# Patient Record
Sex: Female | Born: 2000 | Race: Black or African American | Hispanic: No | Marital: Single | Smoking: Never smoker
Health system: Southern US, Community
[De-identification: ages and names within clinical notes are randomized; demographics above are authoritative.]

## PROBLEM LIST (undated history)

## (undated) DIAGNOSIS — T7840XA Allergy, unspecified, initial encounter: Secondary | ICD-10-CM

## (undated) DIAGNOSIS — K8301 Primary sclerosing cholangitis: Secondary | ICD-10-CM

## (undated) HISTORY — DX: Allergy, unspecified, initial encounter: T78.40XA

## (undated) HISTORY — DX: Primary sclerosing cholangitis: K83.01

---
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
## 2018-01-01 ENCOUNTER — Telehealth (HOSPITAL_COMMUNITY): Payer: Self-pay

## 2018-01-01 NOTE — Telephone Encounter (Signed)
Bacterial Vaginosis test is positive.  Prescription for metronidazole was given at the urgent care visit. Pt contacted regarding results. Answered all questions. Verbalized understanding.   

## 2018-04-29 ENCOUNTER — Ambulatory Visit (HOSPITAL_COMMUNITY)
Admission: EM | Admit: 2018-04-29 | Discharge: 2018-04-29 | Disposition: A | Discharge status: Home / Self Care | Payer: Managed Care, Other (non HMO) | Attending: Family Medicine | Admitting: Family Medicine

## 2018-04-29 ENCOUNTER — Encounter (HOSPITAL_COMMUNITY): Payer: Self-pay | Admitting: Emergency Medicine

## 2018-04-29 ENCOUNTER — Other Ambulatory Visit: Payer: Self-pay

## 2018-04-29 DIAGNOSIS — Z975 Presence of (intrauterine) contraceptive device: Secondary | ICD-10-CM | POA: Diagnosis not present

## 2018-04-29 DIAGNOSIS — Z3202 Encounter for pregnancy test, result negative: Secondary | ICD-10-CM | POA: Diagnosis not present

## 2018-04-29 DIAGNOSIS — N898 Other specified noninflammatory disorders of vagina: Secondary | ICD-10-CM | POA: Diagnosis not present

## 2018-04-29 LAB — POCT I-STAT, CHEM 8
BUN: 10 mg/dL (ref 6–20)
CALCIUM ION: 1.22 mmol/L (ref 1.15–1.40)
CHLORIDE: 102 mmol/L (ref 98–111)
CREATININE: 0.7 mg/dL (ref 0.44–1.00)
GLUCOSE: 82 mg/dL (ref 70–99)
HCT: 40 % (ref 36.0–46.0)
Hemoglobin: 13.6 g/dL (ref 12.0–15.0)
Potassium: 3.9 mmol/L (ref 3.5–5.1)
Sodium: 138 mmol/L (ref 135–145)
TCO2: 27 mmol/L (ref 22–32)

## 2018-04-29 LAB — POCT URINALYSIS DIP (DEVICE)
Bilirubin Urine: NEGATIVE
GLUCOSE, UA: NEGATIVE mg/dL
HGB URINE DIPSTICK: NEGATIVE
KETONES UR: NEGATIVE mg/dL
Leukocytes, UA: NEGATIVE
Nitrite: NEGATIVE
PH: 8.5 — AB (ref 5.0–8.0)
PROTEIN: NEGATIVE mg/dL
SPECIFIC GRAVITY, URINE: 1.015 (ref 1.005–1.030)
UROBILINOGEN UA: 4 mg/dL — AB (ref 0.0–1.0)

## 2018-04-29 LAB — POCT PREGNANCY, URINE: Preg Test, Ur: NEGATIVE

## 2018-04-29 MED ORDER — METRONIDAZOLE 500 MG PO TABS: 500.0000 mg | ORAL_TABLET | Freq: Two times a day (BID) | ORAL | 0 refills | Status: DC

## 2018-04-29 MED ORDER — FLUCONAZOLE 150 MG PO TABS: 150.0000 mg | ORAL_TABLET | Freq: Every day | ORAL | 0 refills | Status: DC

## 2018-04-29 NOTE — Discharge Instructions (Signed)
It was good seeing you today Holly Gonzalez! Take medications as prescribed. Drink plenty of fluids. You may or may not receive a call regarding your test results. Check mychart in a few days to review all of your test results. Follow-up with your GYN as soon as possible to get your IUD removed.

## 2018-04-29 NOTE — ED Triage Notes (Signed)
Pt reports yellow vaginal discharge and odor that started on Sunday.  She denies any other symptoms.

## 2018-04-29 NOTE — ED Provider Notes (Signed)
[facility]    CSN: [id number] Arrival date & time: 04/29/18  1248     History   Chief Complaint Chief Complaint  Patient presents with  . Vaginal Discharge    HPI Holly Gonzalez is a 17 y.o. female.   Subjective:   Holly Gonzalez is a 17 y.o. female who presents for evaluation of vaginal discharge yellow. Symptoms have been present for the past 4 days. She denies any abnormal bleeding, blisters, bumps, burning, local irritation, pain, urinary symptoms of dysuria, fever, flank pain, back pain, hematuria, lower abdominal pain, nausea, urinary frequency, urinary urgency, vomiting or vulvar itching. Notably, patient reports that she has had intermittent episodes of vaginal discharge in the past. This has been since having her IUD placed. 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   Menstrual pattern: bleeding regularly.  Contraception: IUD Sexually Active: with one female partner, uses condoms occasionally  STI Risk: Very low risk of STD exposure   The following portions of the patient's history were reviewed and updated as appropriate: allergies, current medications, past family history, past medical history, past social history, past surgical history and problem list.          History reviewed. No pertinent past medical history.  There are no active problems to display for this patient.   History reviewed. No pertinent surgical history.  OB History   None      Home Medications    Prior to Admission medications   Medication Sig Start Date End Date Taking? Authorizing Provider  fluconazole (DIFLUCAN) 150 MG tablet Take 1 tablet (150 mg total) by mouth daily. Take 1 tablet (150 mg) by mouth now then repeat once in 7 days after completing antibiotics 04/29/18   Enrique Sack, FNP  hydrocortisone (ANUSOL-HC) 2.5 % rectal cream  Apply rectally 2 times daily prn itching or pain 10/14/13   Gregor Hams, MD  metroNIDAZOLE (FLAGYL) 500 MG tablet Take 1 tablet (500 mg total) by mouth 2 (two) times daily. 04/29/18   Enrique Sack, FNP  polyethylene glycol powder (GLYCOLAX/MIRALAX) powder Take 17 g by mouth daily. 10/14/13   Gregor Hams, MD    Family History History reviewed. No pertinent family history.  Social History Social History   Tobacco Use  . Smoking status: Never Smoker  Substance Use Topics  . Alcohol use: No  . Drug use: No     Allergies   Penicillin g   Review of Systems Review of Systems  Constitutional: Negative for fever.  Genitourinary: Positive for vaginal discharge. Negative for decreased urine volume, dysuria, flank pain, frequency, genital sores, hematuria, menstrual problem, pelvic pain, urgency and vaginal pain.  Musculoskeletal: Negative for back pain.  All other systems reviewed and are negative.    Physical Exam Triage Vital Signs ED Triage Vitals [04/29/18 1344]  Enc Vitals Group     BP 106/66     Pulse Rate 92     Resp      Temp 98.5 F (36.9 C)     Temp Source Oral     SpO2 100 %     Weight      Height      Head Circumference      Peak Flow      Pain Score 0     Pain Loc      Pain Edu?  Excl. in [place]?    No data found.  Updated Vital Signs BP 106/66 (BP Location: Left Arm)   Pulse 92   Temp 98.5 F (36.9 C) (Oral)   LMP 04/12/2018 (Approximate)   SpO2 100%   Visual Acuity Right Eye Distance:   Left Eye Distance:   Bilateral Distance:    Right Eye Near:   Left Eye Near:    Bilateral Near:     Physical Exam  Constitutional: She is oriented to person, place, and time. She appears well-developed and well-nourished.  Neck: Normal range of motion.  Cardiovascular: Normal rate and regular rhythm.  Pulmonary/Chest: Effort normal and breath sounds normal.  Genitourinary:  Genitourinary Comments: Female chaperone present. External genitalia  without erythema, exudate or discharge. Vaginal vault with moderate amount of thin yellow discharge. Cervix is of normal color and without any lesions. The cervical os is closed. IUD string noted. No bleeding noted. Uterus is noted to be of normal size and nontender. No cervical motion tenderness. No palpable masses. The adnexa are without any massess or tenderness.   Musculoskeletal: Normal range of motion.  Neurological: She is alert and oriented to person, place, and time.  Skin: Skin is warm and dry.  Psychiatric: She has a normal mood and affect.     UC Treatments / Results  Labs (all labs ordered are listed, but only abnormal results are displayed) Labs Reviewed  POCT URINALYSIS DIP (DEVICE) - Abnormal; Notable for the following components:      Result Value   pH 8.5 (*)    Urobilinogen, UA 4.0 (*)    All other components within normal limits  POCT PREGNANCY, URINE  POCT I-STAT, CHEM 8  CERVICOVAGINAL ANCILLARY ONLY    EKG None  Radiology No results found.  Procedures Procedures (including critical care time)  Medications Ordered in UC Medications - No data to display  Initial Impression / Assessment and Plan / UC Course  I have reviewed the triage vital signs and the nursing notes.  Pertinent labs & imaging results that were available during my care of the patient were reviewed by me and considered in my medical decision making (see chart for details).    17 yo female presenting with vaginal discharge x 4 days. No abnormal bleeding, blisters, bumps, burning, local irritation, pain, urinary symptoms of dysuria, fever, flank pain, back pain, hematuria, lower abdominal pain, nausea, urinary frequency, urinary urgency, vomiting or vulvar itching. Urine pregnancy negative. UA Negative for glucose, protein, ketones, nitrates, leukocytes or blood. However, patient has elevated pH and urobilinogen. I-stat chem 8 unremarkable. Testing for GC/chlamydia, trichomonas, BV and  Candidiasis pending.  Plan:  Flagyl BID x 7 days  Diflucan 150 mg PO x 2, 1 tablet now and repeat once in 7 days after completing antibiotics  Drink plenty of fluids  Discussed safe sex. Follow up with GYN as soon as possible to get IUD removed   Today's evaluation has revealed no signs of a dangerous process. Discussed diagnosis with patient. Patient aware of their diagnosis, possible red flag symptoms to watch out for and need for close follow up. Patient understands verbal and written discharge instructions. Patient comfortable with plan and disposition.  Patient has a clear mental status at this time, good insight into illness (after discussion and teaching) and has clear judgment to make decisions regarding their care.  Documentation was completed with the aid of voice recognition software. Transcription may contain typographical errors.  Final Clinical Impressions(s) / UC Diagnoses  Final diagnoses:  Vaginal discharge     Discharge Instructions     It was good seeing you today Holly Gonzalez! Take medications as prescribed. Drink plenty of fluids. You may or may not receive a call regarding your test results. Check mychart in a few days to review all of your test results. Follow-up with your GYN as soon as possible to get your IUD removed.     ED Prescriptions    Medication Sig Dispense Auth. Provider   fluconazole (DIFLUCAN) 150 MG tablet Take 1 tablet (150 mg total) by mouth daily. Take 1 tablet (150 mg) by mouth now then repeat once in 7 days after completing antibiotics 2 tablet Enrique Sack, FNP   metroNIDAZOLE (FLAGYL) 500 MG tablet Take 1 tablet (500 mg total) by mouth 2 (two) times daily. 14 tablet Enrique Sack, FNP     Controlled Substance Prescriptions [place] Controlled Substance Registry consulted? Not Applicable   Enrique Sack, [place] 04/29/18 1541

## 2018-04-30 LAB — CERVICOVAGINAL ANCILLARY ONLY
BACTERIAL VAGINITIS: POSITIVE — AB
Candida vaginitis: NEGATIVE
Chlamydia: NEGATIVE
Neisseria Gonorrhea: NEGATIVE
TRICH (WINDOWPATH): NEGATIVE

## 2019-07-22 HISTORY — PX: MOUTH SURGERY: SHX715

## 2019-08-03 ENCOUNTER — Telehealth: Payer: Managed Care, Other (non HMO)

## 2019-10-20 ENCOUNTER — Ambulatory Visit: Payer: Managed Care, Other (non HMO) | Attending: Family

## 2019-10-20 DIAGNOSIS — Z23 Encounter for immunization: Secondary | ICD-10-CM

## 2019-10-20 NOTE — Progress Notes (Signed)
   Covid-19 Vaccination Clinic  Name:  Holly Gonzalez    MRN: [id number] DOB: 2001/05/09  10/20/2019  [name] was observed post Covid-19 immunization for 15 minutes without incident. She was provided with Vaccine Information Sheet and instruction to access the V-Safe system.   [name] was instructed to call 911 with any severe reactions post vaccine: Marland Kitchen Difficulty breathing  . Swelling of face and throat  . A fast heartbeat  . A bad rash all over body  . Dizziness and weakness   Immunizations Administered    Name Date Dose VIS Date Route   Moderna COVID-19 Vaccine 10/20/2019 12:57 PM 0.5 mL 06/21/2019 Intramuscular   Manufacturer: Moderna   Lot: [id number]   [id number]

## 2019-11-22 ENCOUNTER — Ambulatory Visit: Payer: Managed Care, Other (non HMO) | Attending: Family

## 2019-11-22 DIAGNOSIS — Z23 Encounter for immunization: Secondary | ICD-10-CM

## 2019-11-22 NOTE — Progress Notes (Signed)
   Covid-19 Vaccination Clinic  Name:  Holly Gonzalez    MRN: [id number] DOB: 2001/02/18  11/22/2019  [name] was observed post Covid-19 immunization for 15 minutes without incident. She was provided with Vaccine Information Sheet and instruction to access the V-Safe system.   [name] was instructed to call 911 with any severe reactions post vaccine: Marland Kitchen Difficulty breathing  . Swelling of face and throat  . A fast heartbeat  . A bad rash all over body  . Dizziness and weakness   Immunizations Administered    Name Date Dose VIS Date Route   Moderna COVID-19 Vaccine 11/22/2019  3:39 PM 0.5 mL 06/2019 Intramuscular   Manufacturer: Moderna   Lot: [id number]   [id number]

## 2020-05-22 ENCOUNTER — Ambulatory Visit: Payer: Self-pay

## 2020-05-30 ENCOUNTER — Encounter: Payer: Managed Care, Other (non HMO) | Admitting: Obstetrics and Gynecology

## 2020-06-12 ENCOUNTER — Encounter: Payer: Self-pay | Admitting: Obstetrics and Gynecology

## 2020-06-28 ENCOUNTER — Other Ambulatory Visit: Payer: Self-pay

## 2020-06-28 ENCOUNTER — Encounter: Payer: Self-pay | Admitting: Allergy

## 2020-06-28 ENCOUNTER — Ambulatory Visit (INDEPENDENT_AMBULATORY_CARE_PROVIDER_SITE_OTHER): Payer: Managed Care, Other (non HMO) | Admitting: Allergy

## 2020-06-28 VITALS — BP 98/60 | HR 81 | Temp 97.9°F | Resp 18 | Ht 62.0 in | Wt 158.8 lb

## 2020-06-28 DIAGNOSIS — H6122 Impacted cerumen, left ear: Secondary | ICD-10-CM

## 2020-06-28 DIAGNOSIS — J3089 Other allergic rhinitis: Secondary | ICD-10-CM | POA: Diagnosis not present

## 2020-06-28 DIAGNOSIS — H1013 Acute atopic conjunctivitis, bilateral: Secondary | ICD-10-CM | POA: Diagnosis not present

## 2020-06-28 MED ORDER — MONTELUKAST SODIUM 10 MG PO TABS: 10.0000 mg | ORAL_TABLET | Freq: Every day | ORAL | 5 refills | Status: DC

## 2020-06-28 NOTE — Progress Notes (Addendum)
New Patient Note  RE: Holly Gonzalez MRN: [id number] DOB: 05-28-2001 Date of Office Visit: 06/28/2020  Referring provider: No ref. provider found Primary care provider: Patient, No Pcp Per  Chief Complaint: seasonal allergies  History of present illness: Holly Gonzalez is a 19 y.o. female presenting today for evaluation of seasonal allergies.  She does not have a PCP at this time.  She states her allergies have worsened over years but this year has been the worst.  Symptoms include watery eyes, runny nose, sneezing, couhing.  spring and fall are worst seasons for her but symptoms are year-round. She has been on zyrtec for years.  Has tried claritin and states it works from time to time.  She has been taking allegra more recently but states doesn't help all the time.  She feels like allegra treats some symptoms but not all.   She has used nose sprays but does not "care for it at all".  She states she can feel the nose spray in her body and hates the feeling.  She states flonase was the worst nasal spray she has tried.  Has not tried nasal saline rinses before.  She states she is okay with using an eyedrop.  Her family does have horses and she is around them year-round. Denies any history of food allergy or any food allergy concerns at this time.  No history of asthma or eczema.  Review of systems: Review of Systems  Constitutional: Negative.   HENT:       See HPI  Eyes:       See HPI  Respiratory: Positive for cough. Negative for sputum production, shortness of breath and wheezing.   Cardiovascular: Negative.   Gastrointestinal: Negative.   Musculoskeletal: Negative.   Skin: Negative.   Neurological: Negative.     All other systems negative unless noted above in HPI  Past medical history: History reviewed. No pertinent past medical history.  Past surgical history: Past Surgical History:  Procedure Laterality Date  . MOUTH SURGERY  2021   tooth extraction    Family  history:  Family History  Problem Relation Age of Onset  . Asthma Neg Hx   . Eczema Neg Hx   . Allergic Disorder Neg Hx     Social history: Lives in a townhome with carpeting in the bedroom with central cooling.  No pets in the home.  There is no concern for water damage, mildew or roaches in the home.  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  Does not report a smoking history.  Medication List: Current Outpatient Medications  Medication Sig Dispense Refill  . fexofenadine (ALLEGRA) 180 MG tablet Take 180 mg by mouth daily as needed for allergies or rhinitis.    Marland Kitchen guaiFENesin (MUCINEX) 600 MG 12 hr tablet Take 600 mg by mouth 2 (two) times daily as needed.    . loratadine (CLARITIN) 10 MG tablet Take 10 mg by mouth daily as needed for allergies.    . polyethylene glycol powder (GLYCOLAX/MIRALAX) powder Take 17 g by mouth daily. 850 g 1   No current facility-administered medications for this visit.    Known medication allergies: Allergies  Allergen Reactions  . Penicillin G Hives     Physical examination: Blood pressure 98/60, pulse 81, temperature 97.9 F (36.6 C), temperature source Temporal, resp. rate 18, height 5' 2"  (1.575 m), weight 158 lb 12.8 oz (72 kg), SpO2 100 %.  General: Alert, interactive, in no acute  distress. HEENT: PERRLA, right TMs pearly gray, left TM obscured by cerumen impaction, turbinates moderately edematous without discharge, post-pharynx non erythematous. Neck: Supple without lymphadenopathy. Lungs: Clear to auscultation without wheezing, rhonchi or rales. {no increased work of breathing. CV: Normal S1, S2 without murmurs. Abdomen: Nondistended, nontender. Skin: Warm and dry, without lesions or rashes. Extremities:  No clubbing, cyanosis or edema. Neuro:   Grossly intact.  Diagnositics/Labs:  Allergy testing: Environmental allergy skin prick testing is positive to grass pollens, weed pollens, tree pollens, pullulara,  horse. Intradermal testing is negative Allergy testing results were read and interpreted by provider, documented by clinical staff.   Assessment and plan:   Allergic rhinitis with conjunctivitis   -Environmental allergy skin testing is positive to grass pollens, weed pollens, tree pollens, mold, horse   -Allergen avoidance measures discussed/handouts provided   -Recommend trial of Xyzal 5 mg daily as needed.  This is a long-acting antihistamine in the same category as Zyrtec, Allegra and Claritin.  Xyzal may be more effective for you than these other antihistamines.  It is over-the-counter.   -start Singulair 36m daily at bedtime.  This is not an antihistamine but can work alongside your antihistamine for better allergy symptom control.  If you notice any change in mood/behavior/sleep after starting Singulair then stop this medication and let u[place]now.  Symptoms resolve after stopping the medication.     -Discussed nasal saline rinses today and if interested can perform this to help clean/flush the nasal passages.  Use distilled water or boil water and bring down to room temperature prior to use.  Breathe through your mouth the entire process.  Provided with a rinse kit today   -For itchy/watery eyes use over-the-counter Pataday or Pataday Xtra Strength 1 drop each eye daily as needed   -Allergen immunotherapy discussed today including protocol, benefits and risk.  Informational handout provided.  If interested in this therapuetic option you can check with your insurance carrier for coverage.  Let u[place]now if you would like to proceed with this option.    Cerumen impaction   -Recommended she try over-the-counter Debrox to help loosen the wax and flush it out.  If this does not help would advise she go to an urgent care or identify PCP for ear irrigation.  Follow-up in 4-6 months or sooner if needed  I appreciate the opportunity to take part in Holly Gonzalez's care. Please do not hesitate to contact me  with questions.  Sincerely,   SPrudy Feeler MD Allergy/Immunology [facility] [place]

## 2020-06-28 NOTE — Patient Instructions (Addendum)
Allergic rhinitis with conjunctivitis   -Environmental allergy skin testing is positive to grass pollens, weed pollens, tree pollens, mold, horse   -Allergen avoidance measures discussed/handouts provided   -Recommend trial of Xyzal 5 mg daily as needed.  This is a long-acting antihistamine in the same category as Zyrtec, Allegra and Claritin.  Xyzal may be more effective for you than these other antihistamines.  It is over-the-counter.   -start Singulair 50m daily at bedtime.  This is not an antihistamine but can work alongside your antihistamine for better allergy symptom control.  If you notice any change in mood/behavior/sleep after starting Singulair then stop this medication and let [facility].  Symptoms resolve after stopping the medication.     -Discussed nasal saline rinses today and if interested can perform this to help clean/flush the nasal passages.  Use distilled water or boil water and bring down to room temperature prior to use.  Breathe through your mouth the entire process.  Provided with a rinse kit today   -For itchy/watery eyes use over-the-counter Pataday or Pataday Xtra Strength 1 drop each eye daily as needed   -Allergen immunotherapy discussed today including protocol, benefits and risk.  Informational handout provided.  If interested in this therapuetic option you can check with your insurance carrier for coverage.  Let [facility] if you would like to proceed with this option.    Follow-up in 4-6 months or sooner if needed

## 2020-07-16 ENCOUNTER — Ambulatory Visit
Admission: EM | Admit: 2020-07-16 | Discharge: 2020-07-16 | Disposition: A | Discharge status: Home / Self Care | Payer: Managed Care, Other (non HMO) | Attending: Emergency Medicine | Admitting: Emergency Medicine

## 2020-07-16 ENCOUNTER — Other Ambulatory Visit: Payer: Self-pay

## 2020-07-16 DIAGNOSIS — N898 Other specified noninflammatory disorders of vagina: Secondary | ICD-10-CM

## 2020-07-16 LAB — URINALYSIS, COMPLETE (UACMP) WITH MICROSCOPIC
Glucose, UA: NEGATIVE mg/dL
Hgb urine dipstick: NEGATIVE
Ketones, ur: 15 mg/dL — AB
Nitrite: NEGATIVE
Protein, ur: 30 mg/dL — AB
RBC / HPF: NONE SEEN RBC/hpf (ref 0–5)
Specific Gravity, Urine: 1.025 (ref 1.005–1.030)
pH: 6 (ref 5.0–8.0)

## 2020-07-16 LAB — WET PREP, GENITAL
Clue Cells Wet Prep HPF POC: NONE SEEN
Sperm: NONE SEEN
Trich, Wet Prep: NONE SEEN
Yeast Wet Prep HPF POC: NONE SEEN

## 2020-07-16 LAB — PREGNANCY, URINE: Preg Test, Ur: NEGATIVE

## 2020-07-16 NOTE — Discharge Instructions (Addendum)
Testing tonight did not reveal the presence of yeast infection, bacterial vaginosis, or trichomoniasis.  Your urine specimen did not clearly indicate the presence of infection.  We will send urine for culture.  Your vaginal swab is also being sent off for evaluation for possible gonorrhea and chlamydia.  If your urine culture, or your vaginal swabs are positive for any infectious agent we will treat you at that time.  Increase your oral fluid intake to increase urine production and flush your urinary system.  Use over-the-counter Azo-Standard as needed for urinary pressure.  Return for reevaluation should new symptoms develop or your existing symptoms worsen.

## 2020-07-16 NOTE — ED Triage Notes (Signed)
Pt c/o vaginal discharge grey/white in nature with an odor for approx 3 days.  Also c/o lower abdominal pressure with urination.  Denies n/v/d, fever, urinary urgency/frequency, hematuria.

## 2020-07-16 NOTE — ED Provider Notes (Signed)
[facility]    CSN: [id number] Arrival date & time: 07/16/20  9417      History   Chief Complaint Chief Complaint  Patient presents with  . Vaginal Discharge    HPI Holly Gonzalez is a 19 y.o. female.   HPI   19 year old female here for evaluation of vaginal discharge and pressure with urination.  Patient reports that her symptoms started 3 days ago.  Patient states she has had some vaginal itching with a clumpy white discharge that has a fishy odor.  Patient denies fever, back pain, blood in her urine, urinary urgency or frequency, or vaginal bleeding.  Patient is sexually active with a monogamous partner.  They do not use any sort of protection on a regular basis.  History reviewed. No pertinent past medical history.  There are no problems to display for this patient.   Past Surgical History:  Procedure Laterality Date  . MOUTH SURGERY  2021   tooth extraction    OB History   No obstetric history on file.      Home Medications    Prior to Admission medications   Medication Sig Start Date End Date Taking? Authorizing Provider  fexofenadine (ALLEGRA) 180 MG tablet Take 180 mg by mouth daily as needed for allergies or rhinitis.   Yes [provider]  guaiFENesin (MUCINEX) 600 MG 12 hr tablet Take 600 mg by mouth 2 (two) times daily as needed.    [provider]  loratadine (CLARITIN) 10 MG tablet Take 10 mg by mouth daily as needed for allergies.    [provider]  montelukast (SINGULAIR) 10 MG tablet Take 1 tablet (10 mg total) by mouth at bedtime. 06/28/20   Kennith Gain, MD  polyethylene glycol powder (GLYCOLAX/MIRALAX) powder Take 17 g by mouth daily. 10/14/13   Gregor Hams, MD    Family History Family History  Problem Relation Age of Onset  . Asthma Neg Hx   . Eczema Neg Hx   . Allergic Disorder Neg Hx     Social History Social History   Tobacco Use  . Smoking status: Never Smoker  . Smokeless  tobacco: Never Used  Vaping Use  . Vaping Use: Never used  Substance Use Topics  . Alcohol use: Yes    Alcohol/week: 2.0 standard drinks    Types: 2 Glasses of wine per week  . Drug use: No     Allergies   Penicillin g   Review of Systems Review of Systems  Constitutional: Negative for activity change, appetite change and fever.  Gastrointestinal: Positive for abdominal pain. Negative for nausea and vomiting.  Genitourinary: Positive for vaginal discharge. Negative for dysuria, frequency, hematuria, urgency, vaginal bleeding and vaginal pain.  Musculoskeletal: Negative for back pain.  Skin: Negative for rash.  Hematological: Negative.   Psychiatric/Behavioral: Negative.      Physical Exam Triage Vital Signs ED Triage Vitals  Enc Vitals Group     BP 07/16/20 1935 105/69     Pulse Rate 07/16/20 1935 84     Resp 07/16/20 1935 18     Temp 07/16/20 1935 98.5 F (36.9 C)     Temp Source 07/16/20 1935 Oral     SpO2 07/16/20 1935 100 %     Weight --      Height --      Head Circumference --      Peak Flow --      Pain Score 07/16/20 1930 2  Pain Loc --      Pain Edu? --      Excl. in [place]? --    No data found.  Updated Vital Signs BP 105/69 (BP Location: Left Arm)   Pulse 84   Temp 98.5 F (36.9 C) (Oral)   Resp 18   LMP 06/28/2020 (Approximate)   SpO2 100%   Visual Acuity Right Eye Distance:   Left Eye Distance:   Bilateral Distance:    Right Eye Near:   Left Eye Near:    Bilateral Near:     Physical Exam Vitals and nursing note reviewed.  Constitutional:      General: She is not in acute distress.    Appearance: Normal appearance. She is normal weight. She is not toxic-appearing.  HENT:     Head: Normocephalic and atraumatic.  Cardiovascular:     Rate and Rhythm: Normal rate and regular rhythm.     Pulses: Normal pulses.     Heart sounds: Normal heart sounds. No murmur heard. No gallop.   Pulmonary:     Effort: Pulmonary effort is normal.      Breath sounds: Normal breath sounds. No wheezing, rhonchi or rales.  Abdominal:     General: Abdomen is flat. Bowel sounds are normal.     Palpations: Abdomen is soft.     Tenderness: There is no abdominal tenderness. There is no right CVA tenderness, left CVA tenderness, guarding or rebound.  Skin:    General: Skin is warm and dry.     Capillary Refill: Capillary refill takes less than 2 seconds.     Findings: No erythema or rash.  Neurological:     General: No focal deficit present.     Mental Status: She is alert and oriented to person, place, and time.  Psychiatric:        Mood and Affect: Mood normal.        Behavior: Behavior normal.        Thought Content: Thought content normal.        Judgment: Judgment normal.      UC Treatments / Results  Labs (all labs ordered are listed, but only abnormal results are displayed) Labs Reviewed  WET PREP, GENITAL - Abnormal; Notable for the following components:      Result Value   WBC, Wet Prep HPF POC MODERATE (*)    All other components within normal limits  URINALYSIS, COMPLETE (UACMP) WITH MICROSCOPIC - Abnormal; Notable for the following components:   Color, Urine AMBER (*)    Bilirubin Urine MODERATE (*)    Ketones, ur 15 (*)    Protein, ur 30 (*)    Leukocytes,Ua TRACE (*)    Bacteria, UA FEW (*)    All other components within normal limits  CHLAMYDIA/NGC RT PCR ([facility] ONLY)  URINE CULTURE  PREGNANCY, URINE    EKG   Radiology No results found.  Procedures Procedures (including critical care time)  Medications Ordered in UC Medications - No data to display  Initial Impression / Assessment and Plan / UC Course  I have reviewed the triage vital signs and the nursing notes.  Pertinent labs & imaging results that were available during my care of the patient were reviewed by me and considered in my medical decision making (see chart for details).   Patient is here for evaluation of vaginal discharge and  pressure in her lower abdomen with urination.  Patient denies pain with urination, urinary urgency or frequency.  Patient's abdominal  exam is benign.  Her belly is soft, nontender, nondistended, with positive bowel sounds in all 4 quadrants.  No CVA tenderness.  Will send wet prep, UA, GC and chlamydia, and U. Preg.  Wet prep is negative for BV, trichomoniasis, or yeast.  Urinalysis shows trace leukocytes no nitrites 6-10 squamous and 6-10 WBCs with few bacteria.  Suspect contaminated sample will send for culture.  Pregnancy test is negative.  Gonorrhea and Chlamydia test is pending.   Final Clinical Impressions(s) / UC Diagnoses   Final diagnoses:  Vaginal discharge     Discharge Instructions     Testing tonight did not reveal the presence of yeast infection, bacterial vaginosis, or trichomoniasis.  Your urine specimen did not clearly indicate the presence of infection.  We will send urine for culture.  Your vaginal swab is also being sent off for evaluation for possible gonorrhea and chlamydia.  If your urine culture, or your vaginal swabs are positive for any infectious agent we will treat you at that time.  Increase your oral fluid intake to increase urine production and flush your urinary system.  Use over-the-counter Azo-Standard as needed for urinary pressure.  Return for reevaluation should new symptoms develop or your existing symptoms worsen.    ED Prescriptions    None     PDMP not reviewed this encounter.   Margarette Canada, NP 07/16/20 2031

## 2020-07-17 LAB — CHLAMYDIA/NGC RT PCR (ARMC ONLY)
Chlamydia Tr: NOT DETECTED
N gonorrhoeae: NOT DETECTED

## 2020-07-18 LAB — URINE CULTURE
Culture: 3000 — AB
Special Requests: NORMAL

## 2020-07-23 ENCOUNTER — Other Ambulatory Visit: Payer: Managed Care, Other (non HMO)

## 2020-07-23 DIAGNOSIS — Z20822 Contact with and (suspected) exposure to covid-19: Secondary | ICD-10-CM

## 2020-07-24 ENCOUNTER — Other Ambulatory Visit: Payer: Managed Care, Other (non HMO)

## 2020-07-25 LAB — NOVEL CORONAVIRUS, NAA: SARS-CoV-2, NAA: NOT DETECTED

## 2020-07-25 LAB — SARS-COV-2, NAA 2 DAY TAT

## 2020-09-21 ENCOUNTER — Other Ambulatory Visit: Payer: Self-pay | Admitting: *Deleted

## 2020-09-21 ENCOUNTER — Telehealth: Payer: Self-pay | Admitting: Allergy

## 2020-09-21 MED ORDER — MONTELUKAST SODIUM 10 MG PO TABS: 10.0000 mg | ORAL_TABLET | Freq: Every day | ORAL | 5 refills | Status: DC

## 2020-09-21 NOTE — Telephone Encounter (Signed)
Pt called and would like a refill for singulair to be sent in [place]. [address], [place], [place] [address]    Please advise.

## 2020-09-21 NOTE — Telephone Encounter (Signed)
Singulair has been sent to [facility].

## 2020-10-19 NOTE — Telephone Encounter (Signed)
Called patient and placed her on the schedule to be seen next week.

## 2020-10-19 NOTE — Telephone Encounter (Signed)
Does she have an upcoming follow-up appointment scheduled already?  I think the symptoms need to be discussed at her visits we can best determine what medication she may benefit from.

## 2020-10-19 NOTE — Telephone Encounter (Signed)
Patient called and states that Singulair is not helping her allergies anymore. Patient states she is taking one pill at night, but increased it to two pills at night which worked better, but she was running out of medication faster. Patient is requesting something stronger since we are coming up on pollen season. Patient also would like an eye drop called in for puffy/red and watering eyes. Patient tried regular eye drops from [employer] but states that made her eyes worse.  Please advise.

## 2020-10-24 ENCOUNTER — Encounter: Payer: Self-pay | Admitting: Allergy

## 2020-10-24 ENCOUNTER — Ambulatory Visit (INDEPENDENT_AMBULATORY_CARE_PROVIDER_SITE_OTHER): Payer: Managed Care, Other (non HMO) | Admitting: Allergy

## 2020-10-24 ENCOUNTER — Other Ambulatory Visit: Payer: Self-pay

## 2020-10-24 VITALS — BP 100/62 | HR 74 | Temp 98.4°F | Resp 20

## 2020-10-24 DIAGNOSIS — H1013 Acute atopic conjunctivitis, bilateral: Secondary | ICD-10-CM | POA: Diagnosis not present

## 2020-10-24 DIAGNOSIS — J3089 Other allergic rhinitis: Secondary | ICD-10-CM | POA: Diagnosis not present

## 2020-10-24 DIAGNOSIS — H6122 Impacted cerumen, left ear: Secondary | ICD-10-CM | POA: Diagnosis not present

## 2020-10-24 MED ORDER — OLOPATADINE HCL 0.2 % OP SOLN: 1.0000 [drp] | Freq: Every day | OPHTHALMIC | 5 refills | Status: DC | PRN

## 2020-10-24 MED ORDER — MONTELUKAST SODIUM 10 MG PO TABS: 10.0000 mg | ORAL_TABLET | Freq: Every day | ORAL | 5 refills | Status: DC

## 2020-10-24 MED ORDER — LEVOCETIRIZINE DIHYDROCHLORIDE 5 MG PO TABS: 5.0000 mg | ORAL_TABLET | Freq: Every day | ORAL | 5 refills | Status: AC | PRN

## 2020-10-24 MED ORDER — IPRATROPIUM BROMIDE 0.03 % NA SOLN: 2.0000 | Freq: Two times a day (BID) | NASAL | 5 refills | Status: AC

## 2020-10-24 MED ORDER — AFRIN NASAL SPRAY 0.05 % NA SOLN: NASAL | 2 refills | Status: AC

## 2020-10-24 NOTE — Patient Instructions (Addendum)
Allergic rhinitis with conjunctivitis   -Continue avoidance measures for grass pollens, weed pollens, tree pollens, mold, horse   -Recommend trial of Xyzal 5 mg daily as needed.  This is a long-acting antihistamine in the same category as Zyrtec, Allegra and Claritin.  Xyzal may be more effective for you than these other antihistamines. (this is OTC)   -Continue Singulair 10mg  daily at bedtime.  This is not an antihistamine but can work alongside your antihistamine for better allergy symptom control.  If you notice any change in mood/behavior/sleep after starting Singulair then stop this medication and let us know.  Symptoms resolve after stopping the medication.     -For severe nasal congestion use nasal Afrin (nasal decongestant) 2 sprays each nostril for no more than 3-5 days at a time.  After 5-15 minutes you should be able to breathe more freely through your nose.  Once you can breathe better then use nasal Atrovent below   - Use nasal Atrovent 2 sprays each nostril twice a day at this time.  Atrovent can help with runny nose as well as stuffy nose   -Use nasal saline rinses to help clean/flush the nasal passages.  Use distilled water or boil water and bring down to room temperature prior to use.  Breathe through your mouth the entire process.     -For itchy/watery eyes use Pataday or Pataday Xtra Strength 1 drop each eye daily as needed (this is OTC)   -Allergen immunotherapy discussed and can be started at anytime.  Would however recommend starting once you have settled in [place] and identified a new allergist there  Follow-up as needed

## 2020-10-24 NOTE — Progress Notes (Signed)
Follow-up Note  RE: Holly Gonzalez MRN: [id number] DOB: Apr 03, 2001 Date of Office Visit: 10/24/2020   History of present illness: Holly Gonzalez is a 20 y.o. female presenting today for follow-up of allergic rhinitis with conjunctivitis.  She was last seen in the office on 06/28/2020 by myself.  She states her allergies have been worsening.  She is interested in immunotherapy however she will be moving to [facility] in July for a new job with [employer]. She states she has been having more watery eyes and runny nose currently as her main symptoms.  She states when she went to the pharmacy she did not receive 1 prescription medication which she believes is the Singulair.  But she states she has not been taking any antihistamines.  Xyzal is recommended for her to take at her previous visit.  She also does not have any eyedrops for as needed use either. She states she has tried to use the over-the-counter earwax removal kits but does not believe she has been successful with the left ear.     Review of systems: Review of Systems  Constitutional: Negative.   HENT:       See HPI  Eyes:       See HPI  Respiratory: Negative.   Cardiovascular: Negative.   Gastrointestinal: Negative.   Musculoskeletal: Negative.   Skin: Negative.   Neurological: Negative.     All other systems negative unless noted above in HPI  Past medical/social/surgical/family history have been reviewed and are unchanged unless specifically indicated below.  No changes  Medication List: Current Outpatient Medications  Medication Sig Dispense Refill  . guaiFENesin (MUCINEX) 600 MG 12 hr tablet Take 600 mg by mouth 2 (two) times daily as needed.    Marland Kitchen ipratropium (ATROVENT) 0.03 % nasal spray Place 2 sprays into both nostrils 2 (two) times daily. 30 mL 5  . levocetirizine (XYZAL) 5 MG tablet Take 1 tablet (5 mg total) by mouth daily as needed for allergies. 30 tablet 5  . Olopatadine HCl (PATADAY) 0.2 % SOLN Apply 1 drop  to eye daily as needed. 2.5 mL 5  . oxymetazoline (AFRIN NASAL SPRAY) 0.05 % nasal spray 2 sprays each nostril for no more than 3-5 days at a time. 30 mL 2  . montelukast (SINGULAIR) 10 MG tablet Take 1 tablet (10 mg total) by mouth at bedtime. 30 tablet 5   No current facility-administered medications for this visit.     Known medication allergies: Allergies  Allergen Reactions  . Penicillin G Hives     Physical examination: Blood pressure 100/62, pulse 74, temperature 98.4 F (36.9 C), temperature source Temporal, resp. rate 20, SpO2 98 %.  General: Alert, interactive, in no acute distress. HEENT: PERRLA, right TM pearly gray, left canal with cerumen impaction unable to visualize TM, turbinates markedly edematous with clear discharge, post-pharynx non erythematous. Neck: Supple without lymphadenopathy. Lungs: Clear to auscultation without wheezing, rhonchi or rales. {no increased work of breathing. CV: Normal S1, S2 without murmurs. Abdomen: Nondistended, nontender. Skin: Warm and dry, without lesions or rashes. Extremities:  No clubbing, cyanosis or edema. Neuro:   Grossly intact.  Diagnositics/Labs: None today  Assessment and plan:   Allergic rhinitis with conjunctivitis   -Continue avoidance measures for grass pollens, weed pollens, tree pollens, mold, horse   -Recommend trial of Xyzal 5 mg daily as needed.  This is a long-acting antihistamine in the same category as Zyrtec, Allegra and Claritin.  Xyzal may be more effective for  you than these other antihistamines. (this is OTC)   -Continue Singulair 10mg  daily at bedtime.  This is not an antihistamine but can work alongside your antihistamine for better allergy symptom control.  If you notice any change in mood/behavior/sleep after starting Singulair then stop this medication and let us know.  Symptoms resolve after stopping the medication.     -For severe nasal congestion use nasal Afrin (nasal decongestant) 2 sprays each  nostril for no more than 3-5 days at a time.  After 5-15 minutes you should be able to breathe more freely through your nose.  Once you can breathe better then use nasal Atrovent below   - Use nasal Atrovent 2 sprays each nostril twice a day at this time.  Atrovent can help with runny nose as well as stuffy nose   -Use nasal saline rinses to help clean/flush the nasal passages.  Use distilled water or boil water and bring down to room temperature prior to use.  Breathe through your mouth the entire process.     -For itchy/watery eyes use Pataday or Pataday Xtra Strength 1 drop each eye daily as needed (this is OTC)   -Allergen immunotherapy discussed and can be started at anytime.  Would however recommend starting once you have settled in [place] and identified a new allergist there  Cerumen impaction of left ear -Advised to see if her PCP in curettage or flush out the cerumen impaction  Follow-up as needed Advised we can help identify a new allergist if she is unable to identify one in the [place] area  I appreciate the opportunity to take part in Holly Gonzalez's care. Please do not hesitate to contact me with questions.  Sincerely,   Prudy Feeler, MD Allergy/Immunology [facility]

## 2020-11-22 ENCOUNTER — Ambulatory Visit: Payer: Managed Care, Other (non HMO) | Admitting: Allergy

## 2020-11-22 DIAGNOSIS — J309 Allergic rhinitis, unspecified: Secondary | ICD-10-CM

## 2020-11-26 ENCOUNTER — Other Ambulatory Visit: Payer: Self-pay

## 2020-11-26 ENCOUNTER — Telehealth: Payer: Self-pay

## 2020-11-26 ENCOUNTER — Encounter: Payer: Self-pay | Admitting: Nurse Practitioner

## 2020-11-26 ENCOUNTER — Other Ambulatory Visit (HOSPITAL_COMMUNITY)
Admission: RE | Admit: 2020-11-26 | Discharge: 2020-11-26 | Disposition: A | Discharge status: Home / Self Care | Payer: Managed Care, Other (non HMO) | Source: Ambulatory Visit | Attending: Nurse Practitioner | Admitting: Nurse Practitioner

## 2020-11-26 ENCOUNTER — Ambulatory Visit: Payer: Managed Care, Other (non HMO) | Admitting: Nurse Practitioner

## 2020-11-26 VITALS — BP 92/60 | HR 74 | Temp 98.8°F | Ht 63.5 in | Wt 161.2 lb

## 2020-11-26 DIAGNOSIS — Z0000 Encounter for general adult medical examination without abnormal findings: Secondary | ICD-10-CM | POA: Diagnosis not present

## 2020-11-26 DIAGNOSIS — Z124 Encounter for screening for malignant neoplasm of cervix: Secondary | ICD-10-CM

## 2020-11-26 DIAGNOSIS — Z113 Encounter for screening for infections with a predominantly sexual mode of transmission: Secondary | ICD-10-CM | POA: Diagnosis present

## 2020-11-26 DIAGNOSIS — N760 Acute vaginitis: Secondary | ICD-10-CM

## 2020-11-26 DIAGNOSIS — B9689 Other specified bacterial agents as the cause of diseases classified elsewhere: Secondary | ICD-10-CM

## 2020-11-26 DIAGNOSIS — R945 Abnormal results of liver function studies: Secondary | ICD-10-CM | POA: Diagnosis not present

## 2020-11-26 DIAGNOSIS — R7989 Other specified abnormal findings of blood chemistry: Secondary | ICD-10-CM

## 2020-11-26 NOTE — Patient Instructions (Addendum)
Thank you for choosing [facility]  Go to lab for blood draw.  Preventive Care 22-22 Years Old, Female Preventive care refers to lifestyle choices and visits with your health care provider that can promote health and wellness. At this stage in your life, you may start seeing a primary care physician instead of a pediatrician. It is important to take responsibility for your health and well-being. Preventive care for young adults includes:  A yearly physical exam. This is also called an annual wellness visit.  Regular dental and eye exams.  Immunizations.  Screening for certain conditions.  Healthy lifestyle choices, such as: ? Eating a healthy diet. ? Getting regular exercise. ? Not using drugs or products that contain nicotine and tobacco. ? Limiting alcohol use. What can I expect for my preventive care visit? Physical exam Your health care provider may check your:  Height and weight. These may be used to calculate your BMI (body mass index). BMI is a measurement that tells if you are at a healthy weight.  Heart rate and blood pressure.  Body temperature.  Skin for abnormal spots. Counseling Your health care provider may ask you questions about your:  Past medical problems.  Family's medical history.  Alcohol, tobacco, and drug use.  Home life and relationship well-being.  Access to firearms.  Emotional well-being.  Diet, exercise, and sleep habits.  Sexual activity and sexual health.  Method of birth control.  Menstrual cycle.  Pregnancy history. What immunizations do I need? Vaccines are usually given at various ages, according to a schedule. Your health care provider will recommend vaccines for you based on your age, medical history, and lifestyle or other factors, such as travel or where you work.   What tests do I need? Blood tests  Lipid and cholesterol levels. These may be checked every 5 years starting at age 1.  Hepatitis C  test.  Hepatitis B test. Screening  Pelvic exam and Pap test. This may be done every 3 years starting at age 38.  STD (sexually transmitted disease) testing, if you are at risk.  BRCA-related cancer screening. This may be done if you have a family history of breast, ovarian, tubal, or peritoneal cancers. Other tests  Tuberculosis skin test.  Vision and hearing tests.  Skin exam.  Breast exam. Talk with your health care provider about your test results, treatment options, and if necessary, the need for more tests. Follow these instructions at home: Eating and drinking  Eat a healthy diet that includes fresh fruits and vegetables, whole grains, lean protein, and low-fat dairy products.  Drink enough fluid to keep your urine pale yellow.  Do not drink alcohol if: ? Your health care provider tells you not to drink. ? You are pregnant, may be pregnant, or are planning to become pregnant. ? You are under the legal drinking age. In the [place] the legal drinking age is 62.  If you drink alcohol: ? Limit how much you use to 0-1 drink a day. ? Be aware of how much alcohol is in your drink. In the [place] one drink equals one 12 oz bottle of beer (355 mL), one 5 oz glass of wine (148 mL), or one 1 oz glass of hard liquor (44 mL).   Lifestyle  Take daily care of your teeth and gums. Brush your teeth every morning and night with fluoride toothpaste. Floss one time each day.  Stay active. Exercise for at least 30 minutes 5 or more days of the  week.  Do not use any products that contain nicotine or tobacco, such as cigarettes, e-cigarettes, and chewing tobacco. If you need help quitting, ask your health care provider.  Do not use drugs.  If you are sexually active, practice safe sex. Use a condom or other form of protection to prevent STIs (sexually transmitted infections).  If you do not wish to become pregnant, use a form of birth control. If you plan to become pregnant, see your  health care provider for a prepregnancy visit.  Find healthy ways to cope with stress, such as: ? Meditation, yoga, or listening to music. ? Journaling. ? Talking to a trusted person. ? Spending time with friends and family. Safety  Always wear your seat belt while driving or riding in a vehicle.  Do not drive: ? If you have been drinking alcohol. Do not ride with someone who has been drinking. ? When you are tired or distracted. ? While texting.  Wear a helmet and other protective equipment during sports activities.  If you have firearms in your house, make sure you follow all gun safety procedures.  Seek help if you have been bullied, physically abused, or sexually abused.  Use the Internet responsibly to avoid dangers, such as online bullying and online sex predators. What's next?  Go to your health care provider once a year for an annual wellness visit.  Ask your health care provider how often you should have your eyes and teeth checked.  Stay up to date on all vaccines. This information is not intended to replace advice given to you by your health care provider. Make sure you discuss any questions you have with your health care provider. Document Revised: 03/04/2020 Document Reviewed: 07/01/2018 Elsevier Patient Education  2021 [employer].

## 2020-11-26 NOTE — Telephone Encounter (Signed)
I called pt and LDM asking for a call back and instructing pt of follow up instructions per Nche ,  Pt is to use Debrox for ear wax build up, 3-5 drops in lt ear every night for 3 nights and then to schedule a office visit with Nche 1st available to get ear irrigation.

## 2020-11-26 NOTE — Progress Notes (Signed)
Subjective:    Patient ID: Holly Gonzalez, female    DOB: 2000-11-14, 20 y.o.   MRN: [id number]  Patient presents today for CPE   Chest Pain  This is a recurrent problem. The current episode started more than 1 month ago. The onset quality is sudden. The problem occurs intermittently. The problem has been unchanged. The pain is present in the epigastric region. The pain is mild. The quality of the pain is described as pressure. The pain does not radiate. Pertinent negatives include no abdominal pain, back pain, cough, diaphoresis, dizziness, exertional chest pressure, fever, headaches, irregular heartbeat, malaise/fatigue, nausea, orthopnea, palpitations, PND, shortness of breath, sputum production, syncope, vomiting or weakness. The pain is aggravated by nothing. She has tried nothing for the symptoms. There are no known risk factors.  Pertinent negatives for past medical history include no aneurysm, no anxiety/panic attacks, no CAD, no diabetes, no DVT, no hyperlipidemia, no hypertension, no Kawasaki disease, no PE, no rheumatic fever, no sleep apnea, no stimulant use and no valve disorder.  Her family medical history is significant for diabetes and hypertension.  Pertinent negatives for family medical history include: no CAD, no connective tissue disease, no heart disease, no hyperlipidemia, no Marfan's syndrome, no early MI, no PE, no PVD, no sickle cell disease, no stroke, no sudden death and no TIA.   Sexual History (orientation,birth control, marital status, STD):sexually active, agreed to pelvic, breast , and STD exam today/  Depression/Suicide: Depression screen [facility] 2/9 11/26/2020  Decreased Interest 0  Down, Depressed, Hopeless 0  PHQ - 2 Score 0   No flowsheet data found.  Vision:will schedule  Dental:will schedule  Immunizations: (TDAP, Hep C screen, Pneumovax, Influenza, zoster)  Health Maintenance  Topic Date Due  . HIV Screening  Never done  . Hepatitis C Screening: USPSTF  Recommendation to screen - Ages 72-79 yo.  Never done  . Pap Smear  Never done  . Pap Smear  Never done  . COVID-19 Vaccine (3 - Booster for Moderna series) 05/24/2020  . Flu Shot  02/18/2021  . Tetanus Vaccine  05/29/2029  . HPV Vaccine  Completed   Diet:heart healthy Exercise: 4-5x/week Weight:  Wt Readings from Last 3 Encounters:  11/26/20 161 lb 3.2 oz (73.1 kg)  06/28/20 158 lb 12.8 oz (72 kg)   Fall Risk: Fall Risk  11/26/2020  Number falls in past yr: 0  Injury with Fall? 0  Risk for fall due to : No Fall Risks  Follow up Falls evaluation completed   Medications and allergies reviewed with patient and updated if appropriate.  There are no problems to display for this patient.   Current Outpatient Medications on File Prior to Visit  Medication Sig Dispense Refill  . ipratropium (ATROVENT) 0.03 % nasal spray Place 2 sprays into both nostrils 2 (two) times daily. 30 mL 5  . levocetirizine (XYZAL) 5 MG tablet Take 1 tablet (5 mg total) by mouth daily as needed for allergies. 30 tablet 5  . Olopatadine HCl (PATADAY) 0.2 % SOLN Apply 1 drop to eye daily as needed. 2.5 mL 5  . oxymetazoline (AFRIN NASAL SPRAY) 0.05 % nasal spray 2 sprays each nostril for no more than 3-5 days at a time. 30 mL 2   No current facility-administered medications on file prior to visit.    Past Medical History:  Diagnosis Date  . Allergy     Past Surgical History:  Procedure Laterality Date  . MOUTH SURGERY  2021  tooth extraction    Social History   Socioeconomic History  . Marital status: Single    Spouse name: Not on file  . Number of children: 0  . Years of education: Not on file  . Highest education level: Not on file  Occupational History    Comment: Bio-chem and Journalism  Tobacco Use  . Smoking status: Never Smoker  . Smokeless tobacco: Never Used  Vaping Use  . Vaping Use: Never used  Substance and Sexual Activity  . Alcohol use: Yes    Alcohol/week: 2.0 standard  drinks    Types: 2 Glasses of wine per week  . Drug use: No  . Sexual activity: Yes    Birth control/protection: None  Other Topics Concern  . Not on file  Social History Narrative  . Not on file   Social Determinants of Health   Financial Resource Strain: Not on file  Food Insecurity: Not on file  Transportation Needs: Not on file  Physical Activity: Not on file  Stress: Not on file  Social Connections: Not on file    Family History  Problem Relation Age of Onset  . Diabetes Maternal Aunt   . Hyperlipidemia Maternal Aunt   . Diabetes Paternal Uncle   . Hyperlipidemia Paternal Uncle   . Diabetes Maternal Grandmother   . Hyperlipidemia Maternal Grandmother   . Asthma Neg Hx   . Eczema Neg Hx   . Allergic Disorder Neg Hx         Review of Systems  Constitutional: Negative for diaphoresis, fever, malaise/fatigue and weight loss.  HENT: Negative for congestion and sore throat.   Eyes:       Negative for visual changes  Respiratory: Negative for cough, sputum production and shortness of breath.   Cardiovascular: Positive for chest pain. Negative for palpitations, orthopnea, leg swelling, syncope and PND.  Gastrointestinal: Negative for abdominal pain, blood in stool, constipation, diarrhea, heartburn, nausea and vomiting.  Genitourinary: Negative for dysuria, frequency and urgency.  Musculoskeletal: Negative for back pain, falls, joint pain and myalgias.  Skin: Negative for rash.  Neurological: Negative for dizziness, sensory change, weakness and headaches.  Endo/Heme/Allergies: Does not bruise/bleed easily.  Psychiatric/Behavioral: Negative.    Objective:   Vitals:   11/26/20 1316  BP: 92/60  Pulse: 74  Temp: 98.8 F (37.1 C)  SpO2: 99%   Body mass index is 28.11 kg/m.  Physical Examination:  Physical Exam Vitals reviewed. Exam conducted with a chaperone present.  Constitutional:      General: She is not in acute distress.    Appearance: She is  well-developed and normal weight.  HENT:     Right Ear: Tympanic membrane, ear canal and external ear normal.     Left Ear: Tympanic membrane, ear canal and external ear normal.  Eyes:     Extraocular Movements: Extraocular movements intact.     Conjunctiva/sclera: Conjunctivae normal.  Cardiovascular:     Rate and Rhythm: Normal rate and regular rhythm.     Pulses: Normal pulses.     Heart sounds: Normal heart sounds.  Pulmonary:     Effort: Pulmonary effort is normal. No respiratory distress.     Breath sounds: Normal breath sounds.  Chest:     Chest wall: No tenderness.  Abdominal:     General: Bowel sounds are normal.     Palpations: Abdomen is soft.     Hernia: There is no hernia in the left inguinal area or right inguinal area.  Genitourinary:  General: Normal vulva.     Labia:        Right: No rash, tenderness or lesion.        Left: No rash, tenderness or lesion.      Vagina: Vaginal discharge present. No erythema or tenderness.     Cervix: Normal.     Uterus: Normal.      Adnexa: Right adnexa normal and left adnexa normal.  Musculoskeletal:        General: Normal range of motion.     Cervical back: Normal range of motion and neck supple.     Right lower leg: No edema.     Left lower leg: No edema.  Lymphadenopathy:     Lower Body: No right inguinal adenopathy. No left inguinal adenopathy.  Skin:    General: Skin is warm and dry.  Neurological:     Mental Status: She is alert and oriented to person, place, and time.     Deep Tendon Reflexes: Reflexes are normal and symmetric.  Psychiatric:        Mood and Affect: Mood normal.        Behavior: Behavior normal.        Thought Content: Thought content normal.    ASSESSMENT and PLAN: This visit occurred during the SARS-CoV-2 public health emergency.  Safety protocols were in place, including screening questions prior to the visit, additional usage of staff PPE, and extensive cleaning of exam room while observing  appropriate contact time as indicated for disinfecting solutions.   Wenona was seen today for establish care.  Diagnoses and all orders for this visit:  Preventative health care -     CBC -     Comprehensive metabolic panel -     TSH  Encounter for Papanicolaou smear for cervical cancer screening -     Cytology - PAP( [place]) -     Cervicovaginal ancillary only( [place])  Screen for STD (sexually transmitted disease) -     Cervicovaginal ancillary only( [place]) -     HIV antibody (with reflex) -     RPR -     Hepatitis C Antibody      Problem List Items Addressed This Visit   None   Visit Diagnoses    Preventative health care    -  Primary   Relevant Orders   CBC   Comprehensive metabolic panel   TSH   Encounter for Papanicolaou smear for cervical cancer screening       Relevant Orders   Cytology - PAP( [place])   Cervicovaginal ancillary only( [place])   Screen for STD (sexually transmitted disease)       Relevant Orders   Cervicovaginal ancillary only( [place])   HIV antibody (with reflex)   RPR   Hepatitis C Antibody      Follow up: Return in about 1 year (around 11/26/2021) for CPE (fasting).  Wilfred Lacy, NP

## 2020-11-27 LAB — CBC
HCT: 40.3 % (ref 36.0–46.0)
Hemoglobin: 14.3 g/dL (ref 12.0–15.0)
MCHC: 35.5 g/dL (ref 30.0–36.0)
MCV: 94.6 fl (ref 78.0–100.0)
Platelets: 318 10*3/uL (ref 150.0–400.0)
RBC: 4.26 Mil/uL (ref 3.87–5.11)
RDW: 13.1 % (ref 11.5–15.5)
WBC: 4.9 10*3/uL (ref 4.0–10.5)

## 2020-11-27 LAB — COMPREHENSIVE METABOLIC PANEL
ALT: 134 U/L — ABNORMAL HIGH (ref 0–35)
AST: 97 U/L — ABNORMAL HIGH (ref 0–37)
Albumin: 4.5 g/dL (ref 3.5–5.2)
Alkaline Phosphatase: 695 U/L — ABNORMAL HIGH (ref 39–117)
BUN: 11 mg/dL (ref 6–23)
CO2: 28 mEq/L (ref 19–32)
Calcium: 9.9 mg/dL (ref 8.4–10.5)
Chloride: 100 mEq/L (ref 96–112)
Creatinine, Ser: 0.67 mg/dL (ref 0.40–1.20)
GFR: 124.6 mL/min (ref >60.00)
Glucose, Bld: 82 mg/dL (ref 70–99)
Potassium: 4.1 mEq/L (ref 3.5–5.1)
Sodium: 136 mEq/L (ref 135–145)
Total Bilirubin: 1.5 mg/dL — ABNORMAL HIGH (ref 0.2–1.2)
Total Protein: 8.1 g/dL (ref 6.0–8.3)

## 2020-11-27 LAB — TSH: TSH: 0.83 u[IU]/mL (ref 0.35–4.50)

## 2020-11-27 LAB — HEPATITIS C ANTIBODY
Hepatitis C Ab: NONREACTIVE
SIGNAL TO CUT-OFF: 0.01 (ref <1.00)

## 2020-11-27 LAB — RPR: RPR Ser Ql: NONREACTIVE

## 2020-11-27 LAB — HIV ANTIBODY (ROUTINE TESTING W REFLEX): HIV 1&2 Ab, 4th Generation: NONREACTIVE

## 2020-11-27 NOTE — Addendum Note (Signed)
Addended by: Leana Gamer on: 11/27/2020 03:27 PM   Modules accepted: Orders

## 2020-11-28 LAB — CYTOLOGY - PAP
Adequacy: ABSENT
Diagnosis: NEGATIVE

## 2020-11-29 ENCOUNTER — Ambulatory Visit (HOSPITAL_BASED_OUTPATIENT_CLINIC_OR_DEPARTMENT_OTHER)
Admission: RE | Admit: 2020-11-29 | Discharge: 2020-11-29 | Disposition: A | Discharge status: Home / Self Care | Payer: Managed Care, Other (non HMO) | Source: Ambulatory Visit | Attending: Nurse Practitioner | Admitting: Nurse Practitioner

## 2020-11-29 DIAGNOSIS — R7989 Other specified abnormal findings of blood chemistry: Secondary | ICD-10-CM

## 2020-11-29 DIAGNOSIS — R945 Abnormal results of liver function studies: Secondary | ICD-10-CM | POA: Diagnosis not present

## 2020-11-29 MED ORDER — METRONIDAZOLE 0.75 % VA GEL: 1.0000 | Freq: Every day | VAGINAL | 0 refills | Status: DC

## 2020-11-29 NOTE — Addendum Note (Signed)
Addended by: Leana Gamer on: 11/29/2020 07:20 PM   Modules accepted: Orders

## 2020-11-30 LAB — CERVICOVAGINAL ANCILLARY ONLY
Chlamydia: NEGATIVE
Comment: NEGATIVE
Comment: NEGATIVE
Comment: NEGATIVE
Comment: NORMAL
HSV1: NEGATIVE
HSV2: NEGATIVE
Neisseria Gonorrhea: NEGATIVE
Trichomonas: NEGATIVE

## 2020-12-03 ENCOUNTER — Other Ambulatory Visit: Payer: Self-pay

## 2020-12-04 ENCOUNTER — Ambulatory Visit: Payer: Managed Care, Other (non HMO) | Admitting: Nurse Practitioner

## 2020-12-06 ENCOUNTER — Encounter: Payer: Self-pay | Admitting: Nurse Practitioner

## 2020-12-06 ENCOUNTER — Other Ambulatory Visit: Payer: Self-pay

## 2020-12-06 ENCOUNTER — Ambulatory Visit: Payer: Managed Care, Other (non HMO) | Admitting: Nurse Practitioner

## 2020-12-06 VITALS — BP 100/64 | HR 66 | Temp 98.1°F | Ht 63.5 in | Wt 160.0 lb

## 2020-12-06 DIAGNOSIS — R112 Nausea with vomiting, unspecified: Secondary | ICD-10-CM

## 2020-12-06 DIAGNOSIS — H6122 Impacted cerumen, left ear: Secondary | ICD-10-CM | POA: Diagnosis not present

## 2020-12-06 DIAGNOSIS — R945 Abnormal results of liver function studies: Secondary | ICD-10-CM | POA: Diagnosis not present

## 2020-12-06 DIAGNOSIS — R1013 Epigastric pain: Secondary | ICD-10-CM | POA: Diagnosis not present

## 2020-12-06 DIAGNOSIS — R7989 Other specified abnormal findings of blood chemistry: Secondary | ICD-10-CM

## 2020-12-06 LAB — CBC WITH DIFFERENTIAL/PLATELET
Basophils Absolute: 0 10*3/uL (ref 0.0–0.1)
Basophils Relative: 0.9 % (ref 0.0–3.0)
Eosinophils Absolute: 0.2 10*3/uL (ref 0.0–0.7)
Eosinophils Relative: 4.5 % (ref 0.0–5.0)
HCT: 38.5 % (ref 36.0–46.0)
Hemoglobin: 13.9 g/dL (ref 12.0–15.0)
Lymphocytes Relative: 26.7 % (ref 12.0–46.0)
Lymphs Abs: 1.3 10*3/uL (ref 0.7–4.0)
MCHC: 36 g/dL (ref 30.0–36.0)
MCV: 92.2 fl (ref 78.0–100.0)
Monocytes Absolute: 0.8 10*3/uL (ref 0.1–1.0)
Monocytes Relative: 17.2 % — ABNORMAL HIGH (ref 3.0–12.0)
Neutro Abs: 2.4 10*3/uL (ref 1.4–7.7)
Neutrophils Relative %: 50.7 % (ref 43.0–77.0)
Platelets: 296 10*3/uL (ref 150.0–400.0)
RBC: 4.18 Mil/uL (ref 3.87–5.11)
RDW: 13 % (ref 11.5–15.5)
WBC: 4.7 10*3/uL (ref 4.0–10.5)

## 2020-12-06 LAB — HEPATIC FUNCTION PANEL
ALT: 75 U/L — ABNORMAL HIGH (ref 0–35)
AST: 55 U/L — ABNORMAL HIGH (ref 0–37)
Albumin: 4.1 g/dL (ref 3.5–5.2)
Alkaline Phosphatase: 523 U/L — ABNORMAL HIGH (ref 39–117)
Bilirubin, Direct: 0.3 mg/dL (ref 0.0–0.3)
Total Bilirubin: 1 mg/dL (ref 0.2–1.2)
Total Protein: 7.2 g/dL (ref 6.0–8.3)

## 2020-12-06 LAB — HCG, QUANTITATIVE, PREGNANCY: Quantitative HCG: 0.61 m[IU]/mL

## 2020-12-06 MED ORDER — PROMETHAZINE HCL 12.5 MG PO TABS: 12.5000 mg | ORAL_TABLET | Freq: Three times a day (TID) | ORAL | 0 refills | Status: DC | PRN

## 2020-12-06 NOTE — Patient Instructions (Signed)
Go to lab for blood draw. Use promethazine for nausea  Liver Function Tests Why am I having this test? Liver function tests are done to see how well your liver is working. The proteins and enzymes measured in the tests can alert your health care provider to inflammation, damage, or disease in your liver. It is common to have liver function tests:  When you are taking certain medicines.  If you have liver disease.  If you drink a lot of alcohol.  During annual physical exams.  When you have other conditions that may affect your liver.  If you have symptoms such as yellowing of the skin (jaundice), abdominal pain, or nausea and vomiting. What is being tested? These tests measure various substances in your blood. This may include:  Alanine aminotransferase (ALT). This is an enzyme in the liver.  Aspartate aminotransferase (AST). This is an enzyme in the liver, heart, and muscles.  Alkaline phosphatase (ALP). This is a protein in the liver, bile ducts, bone, and other body tissues.  Total bilirubin. This is a yellow pigment in bile.  Albumin. This is a protein in the liver.  Prothrombin time and international normalized ratio (PT and INR). PT measures the time it takes for your blood to clot. INR is a calculation of blood clotting time based on your PT result.  Total protein. This includes two proteins, albumin and globulin, found in the blood. What kind of sample is taken? A blood sample is required for this test. It is usually collected by inserting a needle into a blood vessel.   How do I prepare for this test? How you prepare will depend on which tests are being done and the reason for doing them. You may need to:  Avoid eating for 4-6 hours before the test, or as told by your health care provider. Follow instructions from your health care provider about eating or drinking restrictions before the tests.  Stop taking certain medicines before your blood test, as told by your  health care provider. Tell a health care provider about:  All medicines you are taking, including vitamins, herbs, eye drops, creams, and over-the-counter medicines.  Any medical conditions you have.  Whether you are pregnant or may be pregnant. How are the results reported? Your test results will be reported as values. Your health care provider will compare your results to normal ranges that were established after testing a large group of people (reference ranges). Reference ranges may vary among labs and hospitals. For the substances measured in liver function tests, common reference ranges are: ALT  Infant: 10-40 international units/L.  Child or adult: 4-36 international units/L at 37C or 4-36 units/L (SI units).  Reference ranges may be higher for older adults. AST  Newborn 55-31 days old: 35-140 units/L.  Child younger than 60 years old: 15-60 units/L.  29-19 years old: 15-50 units/L.  76-36 years old: 10-50 units/L.  44-72 years old: 10-40 units/L.  Adult: 0-35 units/L or 0-0.58 microkatals/L (SI units).  Reference ranges may be higher for older adults. ALP  Child younger than 71 years old: 85-235 units/L.  81-36 years old: 65-210 units/L.  60-19 years old: 60-300 units/L.  2-31 years old: 30-200 units/L.  Adult: 30-120 units/L or 0.5-2.0 microkatals/L (SI units).  Reference ranges may be higher for older adults. Total bilirubin  Newborn: 1.0-12.0 mg/dL or 17.1-205 micromoles/L (SI units).  Child or adult: 0.3-1.0 mg/dL or 5.1-17 micromoles/L. Albumin  Premature infant: 3.0-4.2 g/dL.  Newborn: 3.5-5.4 g/dL.  Infant:  4.4-5.4 g/dL.  Child: 4.0-5.9 g/dL.  Adult: 3.5-5.0 g/dL or 35-50 g/L (SI units). PT  11.0-12.5 seconds; 85%-100%. INR  0.8-1.1. Total protein  Premature infant: 4.2-7.6 g/dL.  Newborn: 4.6-7.4 g/dL.  Infant: 6.0-6.7 g/dL.  Child: 6.2-8.0 g/dL.  Adult: 6.4-8.3 g/dL or 64-83 g/L (SI units). What do the results mean? Results that  are within the reference ranges are considered normal. For each substance measured, results outside the reference range can indicate various health issues. ALT  Levels above the normal range may indicate liver disease. Sometimes levels also increase after burns, surgery, heart attack, muscle damage, or seizure. AST  Levels above the normal range may indicate liver disease, skeletal muscle diseases, or other diseases that destroy the red blood cells or tissues of the pancreas.  Levels below the normal range may indicate acute kidney disease, pregnancy, or diabetic ketoacidosis. ALP  Levels above the normal range may be seen in biliary obstruction, liver diseases, bone disease, thyroid disease, lymphoma, or several other conditions. People with blood type O or B may show higher levels after a fatty meal.  Levels below the normal range may indicate bone and teeth conditions, malnutrition, protein deficiency, or Wilson's disease. Total bilirubin  Levels above the normal range may indicate problems with the liver, gallbladder, or bile ducts. Albumin  Levels above the normal range may indicate dehydration. They may also be caused by a diet that is high in protein.  Levels below the normal range may indicate kidney disease, liver disease, or malabsorption of nutrients. PT and INR  Levels above the normal range mean that your blood is clotting slower than normal. This may be due to blood disorders, liver disorders, certain medicines like warfarin, or low levels of vitamin K. Total protein  Levels above the normal range may be due to infection or other diseases.  Levels below the normal range may be due to an immune system disorder, bleeding, burns, kidney disorder, liver disease, trouble absorbing or getting nutrients, or other conditions that affect the intestines. Talk with your health care provider about what your results mean. Questions to ask your health care provider Ask your health  care provider, or the department that is doing the test:  When will my results be ready?  How will I get my results?  What are my treatment options?  What other tests do I need?  What are my next steps? Summary  Liver function tests are done to see how well your liver is working.  The results can alert your health care provider to inflammation, damage, or disease in your liver.  You may need to avoid eating for 4-6 hours before the test or stop taking certain medicines before your blood test, as told by your health care provider.  Talk with your health care provider about what your results mean. This information is not intended to replace advice given to you by your health care provider. Make sure you discuss any questions you have with your health care provider. Document Revised: 04/10/2020 Document Reviewed: 04/10/2020 Elsevier Patient Education  2021 [employer].

## 2020-12-06 NOTE — Progress Notes (Signed)
Subjective:  Patient ID: Holly Gonzalez, female    DOB: [birth year]  Age: 20 y.o. MRN: [id number]  CC: Acute Visit (Pt c/o clogged ears, left ear worse than right)  GI Problem The primary symptoms include nausea and vomiting. Primary symptoms do not include fever, weight loss, fatigue, abdominal pain, diarrhea, melena, hematemesis, jaundice, hematochezia, dysuria, myalgias, arthralgias or rash. The illness began 2 days ago. The onset was gradual.  The illness is also significant for anorexia. The illness does not include chills, dysphagia, odynophagia, bloating, constipation, tenesmus, back pain or itching. Associated medical issues do not include inflammatory bowel disease, GERD, alcohol abuse, gastric bypass or irritable bowel syndrome.  Ear Fullness  There is pain in the right ear. This is a new problem. The current episode started more than 1 month ago. The problem has been unchanged. There has been no fever. Associated symptoms include vomiting. Pertinent negatives include no abdominal pain, coughing, diarrhea, ear discharge, rash, rhinorrhea or sore throat. She has tried nothing for the symptoms. Her past medical history is significant for hearing loss.   Reviewed past Medical, Social and Family history today.  Outpatient Medications Prior to Visit  Medication Sig Dispense Refill  . ipratropium (ATROVENT) 0.03 % nasal spray Place 2 sprays into both nostrils 2 (two) times daily. 30 mL 5  . levocetirizine (XYZAL) 5 MG tablet Take 1 tablet (5 mg total) by mouth daily as needed for allergies. 30 tablet 5  . oxymetazoline (AFRIN NASAL SPRAY) 0.05 % nasal spray 2 sprays each nostril for no more than 3-5 days at a time. 30 mL 2  . metroNIDAZOLE (METROGEL VAGINAL) 0.75 % vaginal gel Place 1 Applicatorful vaginally at bedtime. (Patient not taking: Reported on 12/06/2020) 70 g 0  . Olopatadine HCl (PATADAY) 0.2 % SOLN Apply 1 drop to eye daily as needed. (Patient not taking: Reported on 12/06/2020) 2.5  mL 5   No facility-administered medications prior to visit.    ROS See HPI  Objective:  BP 100/64 (BP Location: Left Arm, Patient Position: Sitting, Cuff Size: Normal)   Pulse 66   Temp 98.1 F (36.7 C) (Temporal)   Ht 5' 3.5" (1.613 m)   Wt 160 lb (72.6 kg)   LMP 11/12/2020 (Exact Date)   SpO2 98%   BMI 27.90 kg/m   Physical Exam Vitals reviewed.  HENT:     Right Ear: Tympanic membrane, ear canal and external ear normal. There is no impacted cerumen.     Left Ear: External ear normal. There is impacted cerumen.     Ears:     Comments: Normal ear canal and TM after cerumen removal) Abdominal:     General: There is no distension.     Palpations: Abdomen is soft.     Tenderness: There is no guarding.  Neurological:     Mental Status: She is alert and oriented to person, place, and time.    Assessment & Plan:  This visit occurred during the SARS-CoV-2 public health emergency.  Safety protocols were in place, including screening questions prior to the visit, additional usage of staff PPE, and extensive cleaning of exam room while observing appropriate contact time as indicated for disinfecting solutions.   Mahrukh was seen today for acute visit.  Diagnoses and all orders for this visit:  Nausea and vomiting, intractability of vomiting not specified, unspecified vomiting type -     B-HCG Quant -     promethazine (PHENERGAN) 12.5 MG tablet; Take 1 tablet (12.5 mg total)  by mouth every 8 (eight) hours as needed for nausea or vomiting. -     CBC w/Diff  Impacted cerumen of left ear  Abnormal LFTs (liver function tests) -     Hepatic function panel -     Hepatitis B Core Antibody, IgM -     Hepatitis B Core Antibody, total -     Hepatitis E antibody, IgM  Procedure Note :    Procedure :  Ear irrigation  Indication:  Cerumen impaction (left)  Risks, including pain, dizziness, eardrum perforation, bleeding, infection and others as well as benefits were explained to the  patient in detail. Verbal consent was obtained and the patient agreed to proceed.   We used "The Elephant Ear Irrigation Device" filled with lukewarm water for irrigation. A large amount wax was recovered. Procedure has also required manual wax removal with an ear loop.  Tolerated well. Complications: None.  Postprocedure instructions :  Call if problems.  Problem List Items Addressed This Visit   None   Visit Diagnoses    Nausea and vomiting, intractability of vomiting not specified, unspecified vomiting type    -  Primary   Relevant Medications   promethazine (PHENERGAN) 12.5 MG tablet   Other Relevant Orders   B-HCG Quant   CBC w/Diff   Impacted cerumen of left ear       Abnormal LFTs (liver function tests)       Relevant Orders   Hepatic function panel   Hepatitis B Core Antibody, IgM   Hepatitis B Core Antibody, total   Hepatitis E antibody, IgM      Follow-up: Return if symptoms worsen or fail to improve.  Holly Lacy, NP

## 2020-12-07 ENCOUNTER — Other Ambulatory Visit: Payer: Self-pay

## 2020-12-07 ENCOUNTER — Encounter (HOSPITAL_COMMUNITY): Payer: Self-pay | Admitting: Emergency Medicine

## 2020-12-07 ENCOUNTER — Emergency Department (HOSPITAL_COMMUNITY)
Admission: EM | Admit: 2020-12-07 | Discharge: 2020-12-08 | Disposition: A | Discharge status: Home / Self Care | Payer: Managed Care, Other (non HMO) | Attending: Emergency Medicine | Admitting: Emergency Medicine

## 2020-12-07 DIAGNOSIS — R112 Nausea with vomiting, unspecified: Secondary | ICD-10-CM | POA: Diagnosis not present

## 2020-12-07 DIAGNOSIS — R7401 Elevation of levels of liver transaminase levels: Secondary | ICD-10-CM | POA: Insufficient documentation

## 2020-12-07 DIAGNOSIS — R7989 Other specified abnormal findings of blood chemistry: Secondary | ICD-10-CM

## 2020-12-07 DIAGNOSIS — R1111 Vomiting without nausea: Secondary | ICD-10-CM

## 2020-12-07 DIAGNOSIS — R1013 Epigastric pain: Secondary | ICD-10-CM | POA: Insufficient documentation

## 2020-12-07 NOTE — Addendum Note (Signed)
Addended by: Leana Gamer on: 12/07/2020 06:22 PM   Modules accepted: Orders

## 2020-12-07 NOTE — ED Triage Notes (Signed)
Patient here from home reporting abd pain, n/v since Monday. States that she went to PCP and they reported she had elevated liver enzymes. Denies pregnancy.

## 2020-12-08 LAB — CBC
HCT: 38.3 % (ref 36.0–46.0)
Hemoglobin: 13.6 g/dL (ref 12.0–15.0)
MCH: 32.4 pg (ref 26.0–34.0)
MCHC: 35.5 g/dL (ref 30.0–36.0)
MCV: 91.2 fL (ref 80.0–100.0)
Platelets: 304 10*3/uL (ref 150–400)
RBC: 4.2 MIL/uL (ref 3.87–5.11)
RDW: 12.8 % (ref 11.5–15.5)
WBC: 6.1 10*3/uL (ref 4.0–10.5)
nRBC: 0 % (ref 0.0–0.2)

## 2020-12-08 LAB — COMPREHENSIVE METABOLIC PANEL
ALT: 87 U/L — ABNORMAL HIGH (ref 0–44)
AST: 76 U/L — ABNORMAL HIGH (ref 15–41)
Albumin: 3.8 g/dL (ref 3.5–5.0)
Alkaline Phosphatase: 498 U/L — ABNORMAL HIGH (ref 38–126)
Anion gap: 8 (ref 5–15)
BUN: 10 mg/dL (ref 6–20)
CO2: 23 mmol/L (ref 22–32)
Calcium: 8.7 mg/dL — ABNORMAL LOW (ref 8.9–10.3)
Chloride: 105 mmol/L (ref 98–111)
Creatinine, Ser: 0.67 mg/dL (ref 0.44–1.00)
GFR, Estimated: >60 mL/min (ref >60)
Glucose, Bld: 90 mg/dL (ref 70–99)
Potassium: 3.9 mmol/L (ref 3.5–5.1)
Sodium: 136 mmol/L (ref 135–145)
Total Bilirubin: 1.1 mg/dL (ref 0.3–1.2)
Total Protein: 7.6 g/dL (ref 6.5–8.1)

## 2020-12-08 LAB — LIPASE, BLOOD: Lipase: 25 U/L (ref 11–51)

## 2020-12-08 LAB — I-STAT BETA HCG BLOOD, ED (MC, WL, AP ONLY): I-stat hCG, quantitative: <5 m[IU]/mL (ref <5)

## 2020-12-08 MED ORDER — ONDANSETRON 4 MG PO TBDP: 4.0000 mg | ORAL_TABLET | Freq: Three times a day (TID) | ORAL | 0 refills | Status: DC | PRN

## 2020-12-08 MED ORDER — OMEPRAZOLE 20 MG PO CPDR: 20.0000 mg | DELAYED_RELEASE_CAPSULE | Freq: Two times a day (BID) | ORAL | 0 refills | Status: DC

## 2020-12-08 NOTE — ED Provider Notes (Signed)
[facility] DEPT Provider Note   CSN: [id number] Arrival date & time: 12/07/20  2101     History Chief Complaint  Patient presents with  . Abnormal Lab  . Abdominal Pain  . Nausea  . Emesis    Holly Gonzalez is a 20 y.o. female.  Patient presents to the emergency department with a chief complaint of nausea and vomiting.  She reports associated epigastric abdominal pain.  States that she has been having symptoms intermittently for the past couple of weeks.  Was seen recently by her PCP and was diagnosed with elevated LFTs.  She had negative outpatient right upper quadrant ultrasound.  He states that her symptoms persist, and this is what brought her back to the emergency department tonight.  She denies any fever or chills.  She has tried Phenergan, but denies any significant relief.  The history is provided by the patient. No language interpreter was used.       Past Medical History:  Diagnosis Date  . Allergy     There are no problems to display for this patient.   Past Surgical History:  Procedure Laterality Date  . MOUTH SURGERY  2021   tooth extraction     OB History   No obstetric history on file.     Family History  Problem Relation Age of Onset  . Diabetes Maternal Aunt   . Hyperlipidemia Maternal Aunt   . Diabetes Paternal Uncle   . Hyperlipidemia Paternal Uncle   . Diabetes Maternal Grandmother   . Hyperlipidemia Maternal Grandmother   . Asthma Neg Hx   . Eczema Neg Hx   . Allergic Disorder Neg Hx     Social History   Tobacco Use  . Smoking status: Never Smoker  . Smokeless tobacco: Never Used  Vaping Use  . Vaping Use: Never used  Substance Use Topics  . Alcohol use: Yes    Alcohol/week: 2.0 standard drinks    Types: 2 Glasses of wine per week  . Drug use: No    Home Medications Prior to Admission medications   Medication Sig Start Date End Date Taking? Authorizing Provider  ipratropium (ATROVENT) 0.03 %  nasal spray Place 2 sprays into both nostrils 2 (two) times daily. 10/24/20   Kennith Gain, MD  levocetirizine (XYZAL) 5 MG tablet Take 1 tablet (5 mg total) by mouth daily as needed for allergies. 10/24/20   Kennith Gain, MD  oxymetazoline (AFRIN NASAL SPRAY) 0.05 % nasal spray 2 sprays each nostril for no more than 3-5 days at a time. 10/24/20   Kennith Gain, MD  promethazine (PHENERGAN) 12.5 MG tablet Take 1 tablet (12.5 mg total) by mouth every 8 (eight) hours as needed for nausea or vomiting. 12/06/20   Nche, Charlene Brooke, NP    Allergies    Penicillin g  Review of Systems   Review of Systems  All other systems reviewed and are negative.   Physical Exam Updated Vital Signs BP (!) 147/129 (BP Location: Left Arm)   Pulse 72   Temp 98.5 F (36.9 C) (Oral)   Resp 17   Ht 5' 2"  (1.575 m)   Wt 75.5 kg   LMP 11/12/2020 (Exact Date)   SpO2 100%   BMI 30.43 kg/m   Physical Exam Vitals and nursing note reviewed.  Constitutional:      General: She is not in acute distress.    Appearance: She is well-developed.  HENT:     Head:  Normocephalic and atraumatic.  Eyes:     Conjunctiva/sclera: Conjunctivae normal.  Cardiovascular:     Rate and Rhythm: Normal rate and regular rhythm.     Heart sounds: No murmur heard.   Pulmonary:     Effort: Pulmonary effort is normal. No respiratory distress.     Breath sounds: Normal breath sounds.  Abdominal:     Palpations: Abdomen is soft.     Tenderness: There is abdominal tenderness.     Comments: Epigastric tenderness  Musculoskeletal:     Cervical back: Neck supple.  Skin:    General: Skin is warm and dry.  Neurological:     Mental Status: She is alert and oriented to person, place, and time.  Psychiatric:        Mood and Affect: Mood normal.        Behavior: Behavior normal.     ED Results / Procedures / Treatments   Labs (all labs ordered are listed, but only abnormal results are  displayed) Labs Reviewed  LIPASE, BLOOD  COMPREHENSIVE METABOLIC PANEL  CBC  URINALYSIS, ROUTINE W REFLEX MICROSCOPIC  I-STAT BETA HCG BLOOD, ED ([facility], [facility], [facility] ONLY)    EKG None  Radiology No results found.  Procedures Procedures   Medications Ordered in ED Medications - No data to display  ED Course  I have reviewed the triage vital signs and the nursing notes.  Pertinent labs & imaging results that were available during my care of the patient were reviewed by me and considered in my medical decision making (see chart for details).    MDM Rules/Calculators/A&P                          This patient complains of vomiting after eating, this involves an extensive number of treatment options, and is a complaint that carries with it a high risk of complications and morbidity.    Differential Dx Cholecystitis, pancreatitis, gastroenteritis, GERD  Pertinent Labs I ordered, reviewed, and interpreted labs, which included CBC notable for no leukocytosis, CMP without electrolyte derangement, but is notable for mildly elevated AST and ALT, with alk phos being 498.  T bili is 1.1.  Lipase is 25.  These labs are fairly similar to recent labs she has had outpatient.  Plan Patient reassessed, she appears comfortable.  Her vital signs are stable.  We discussed her lab work.  I do not feel that repeat ultrasound is indicated at this time, she does not have any focal right upper quadrant tenderness.  Her recent ultrasound was negative for gallstones.  Lipase is 25, so I think pancreatitis is less likely.  She states that she has been able to drink a smoothie and keep it down.  I wonder if she has esophageal stricture or GERD that is causing her symptoms.  We will put her on a PPI and recommend that she follow-up with gastroenterology.  She is agreeable with this plan.    Final Clinical Impression(s) / ED Diagnoses Final diagnoses:  Vomiting without nausea, intractability of vomiting not  specified, unspecified vomiting type  Elevated LFTs    Rx / DC Orders ED Discharge Orders         Ordered    Ambulatory referral to Gastroenterology        12/08/20 0213    ondansetron (ZOFRAN ODT) 4 MG disintegrating tablet  Every 8 hours PRN        12/08/20 0214    omeprazole (PRILOSEC)  20 MG capsule  2 times daily before meals        12/08/20 0214           Montine Circle, PA-C 12/08/20 0230    Ripley Fraise, MD 12/08/20 [phone]

## 2020-12-08 NOTE — Discharge Instructions (Addendum)
I placed a referral to the gastroenterologist.  Their office should contact you.  I have provided you with their contact information, you may call them as well.  Please take the medication as prescribed.  You may end up needing an endoscopy to further assess your condition.  If you run a fever, have bloody vomit, bloody diarrhea, or significant abdominal pain, please return to the emergency department.

## 2020-12-10 ENCOUNTER — Encounter (HOSPITAL_COMMUNITY): Payer: Self-pay | Admitting: Emergency Medicine

## 2020-12-10 ENCOUNTER — Telehealth: Payer: Self-pay | Admitting: Nurse Practitioner

## 2020-12-10 ENCOUNTER — Encounter: Payer: Self-pay | Admitting: Physician Assistant

## 2020-12-10 NOTE — Addendum Note (Signed)
Addended by: Leana Gamer on: 12/10/2020 01:42 PM   Modules accepted: Orders

## 2020-12-10 NOTE — Telephone Encounter (Signed)
Pt is wanting a cb concerning her most recent lab results. Please advise pt at [phone].

## 2020-12-11 NOTE — Addendum Note (Signed)
Addended by: Wilfred Lacy L on: 12/11/2020 12:32 PM   Modules accepted: Orders

## 2020-12-12 LAB — HEPATITIS B CORE ANTIBODY, TOTAL: Hep B Core Total Ab: NONREACTIVE

## 2020-12-12 LAB — TEST AUTHORIZATION

## 2020-12-12 LAB — HEPATITIS B CORE ANTIBODY, IGM: Hep B C IgM: NONREACTIVE

## 2020-12-12 LAB — HEPATITIS A ANTIBODY, TOTAL: Hepatitis A AB,Total: REACTIVE — AB

## 2020-12-12 LAB — HEPATITIS E ANTIBODY, IGM: HEPATITIS E ANTIBODY (IGM): NOT DETECTED

## 2020-12-12 LAB — TEST AUTHORIZATION 2

## 2020-12-12 LAB — HEPATITIS A ANTIBODY, IGM: Hep A IgM: NONREACTIVE

## 2020-12-12 NOTE — Addendum Note (Signed)
Addended by: Leana Gamer on: 12/12/2020 05:12 PM   Modules accepted: Orders

## 2020-12-13 NOTE — Telephone Encounter (Signed)
Spoke with patient on 12/12/20

## 2020-12-26 ENCOUNTER — Ambulatory Visit
Admission: RE | Admit: 2020-12-26 | Discharge: 2020-12-26 | Disposition: A | Discharge status: Home / Self Care | Payer: Managed Care, Other (non HMO) | Source: Ambulatory Visit | Attending: Nurse Practitioner | Admitting: Nurse Practitioner

## 2020-12-26 ENCOUNTER — Other Ambulatory Visit: Payer: Self-pay

## 2020-12-26 DIAGNOSIS — R7989 Other specified abnormal findings of blood chemistry: Secondary | ICD-10-CM

## 2020-12-26 DIAGNOSIS — R1013 Epigastric pain: Secondary | ICD-10-CM

## 2020-12-26 DIAGNOSIS — R112 Nausea with vomiting, unspecified: Secondary | ICD-10-CM

## 2020-12-26 MED ORDER — GADOBENATE DIMEGLUMINE 529 MG/ML IV SOLN
15.0000 mL | Freq: Once | INTRAVENOUS | Status: AC | PRN
  Administered 2020-12-26: 15 mL via INTRAVENOUS

## 2020-12-27 ENCOUNTER — Emergency Department (HOSPITAL_COMMUNITY)
Admission: EM | Admit: 2020-12-27 | Discharge: 2020-12-27 | Disposition: A | Discharge status: Home / Self Care | Payer: Managed Care, Other (non HMO) | Attending: Emergency Medicine | Admitting: Emergency Medicine

## 2020-12-27 ENCOUNTER — Encounter (HOSPITAL_COMMUNITY): Payer: Self-pay

## 2020-12-27 ENCOUNTER — Telehealth: Payer: Self-pay | Admitting: Nurse Practitioner

## 2020-12-27 ENCOUNTER — Other Ambulatory Visit: Payer: Self-pay

## 2020-12-27 DIAGNOSIS — K8309 Other cholangitis: Secondary | ICD-10-CM | POA: Diagnosis not present

## 2020-12-27 DIAGNOSIS — Z20822 Contact with and (suspected) exposure to covid-19: Secondary | ICD-10-CM | POA: Insufficient documentation

## 2020-12-27 DIAGNOSIS — R1013 Epigastric pain: Secondary | ICD-10-CM | POA: Diagnosis present

## 2020-12-27 LAB — URINALYSIS, ROUTINE W REFLEX MICROSCOPIC
Bilirubin Urine: NEGATIVE
Glucose, UA: NEGATIVE mg/dL
Hgb urine dipstick: NEGATIVE
Ketones, ur: NEGATIVE mg/dL
Leukocytes,Ua: NEGATIVE
Nitrite: NEGATIVE
Protein, ur: NEGATIVE mg/dL
Specific Gravity, Urine: 1.01 (ref 1.005–1.030)
pH: 7 (ref 5.0–8.0)

## 2020-12-27 LAB — RESP PANEL BY RT-PCR (FLU A&B, COVID) ARPGX2
Influenza A by PCR: NEGATIVE
Influenza B by PCR: NEGATIVE
SARS Coronavirus 2 by RT PCR: NEGATIVE

## 2020-12-27 LAB — CBC
HCT: 38.7 % (ref 36.0–46.0)
Hemoglobin: 14.2 g/dL (ref 12.0–15.0)
MCH: 33.3 pg (ref 26.0–34.0)
MCHC: 36.7 g/dL — ABNORMAL HIGH (ref 30.0–36.0)
MCV: 90.6 fL (ref 80.0–100.0)
Platelets: 328 10*3/uL (ref 150–400)
RBC: 4.27 MIL/uL (ref 3.87–5.11)
RDW: 13.1 % (ref 11.5–15.5)
WBC: 5.9 10*3/uL (ref 4.0–10.5)
nRBC: 0 % (ref 0.0–0.2)

## 2020-12-27 LAB — COMPREHENSIVE METABOLIC PANEL
ALT: 156 U/L — ABNORMAL HIGH (ref 0–44)
AST: 120 U/L — ABNORMAL HIGH (ref 15–41)
Albumin: 4.4 g/dL (ref 3.5–5.0)
Alkaline Phosphatase: 507 U/L — ABNORMAL HIGH (ref 38–126)
Anion gap: 8 (ref 5–15)
BUN: 14 mg/dL (ref 6–20)
CO2: 26 mmol/L (ref 22–32)
Calcium: 9.3 mg/dL (ref 8.9–10.3)
Chloride: 103 mmol/L (ref 98–111)
Creatinine, Ser: 0.77 mg/dL (ref 0.44–1.00)
GFR, Estimated: >60 mL/min (ref >60)
Glucose, Bld: 88 mg/dL (ref 70–99)
Potassium: 4.1 mmol/L (ref 3.5–5.1)
Sodium: 137 mmol/L (ref 135–145)
Total Bilirubin: 1.2 mg/dL (ref 0.3–1.2)
Total Protein: 8.7 g/dL — ABNORMAL HIGH (ref 6.5–8.1)

## 2020-12-27 LAB — I-STAT BETA HCG BLOOD, ED (MC, WL, AP ONLY): I-stat hCG, quantitative: <5 m[IU]/mL (ref <5)

## 2020-12-27 LAB — LIPASE, BLOOD: Lipase: 25 U/L (ref 11–51)

## 2020-12-27 MED ORDER — SODIUM CHLORIDE 0.9 % IV SOLN: 1.0000 g | Freq: Once | INTRAVENOUS | Status: DC

## 2020-12-27 MED ORDER — METRONIDAZOLE 500 MG/100ML IV SOLN: 500.0000 mg | Freq: Once | INTRAVENOUS | Status: DC

## 2020-12-27 NOTE — Telephone Encounter (Signed)
Collaborated with [facility]-provider about MRI results: recommended for patient to go to ED for expedited evaluation and repeat labs. I notified Holly Gonzalez about her MRI results and above recommendation from GI She reports persistent nausea, vomiting and ABD pain. She verbalized understanding, but states she will wait till her appt with GI on 01/02/2021 because her symptoms are mild at this time. She also states she will go to he ED if her symptoms worsen.

## 2020-12-27 NOTE — Progress Notes (Signed)
LVM for patient to return call. 

## 2020-12-27 NOTE — ED Triage Notes (Signed)
Patient c/o intermittent chest pain x 2 months. Patient states the last episode was at 0645 today  Patient states she had an MRI yesterday and it showed cholangitis. Patient states she was called by her PCP and told to come to the ED.

## 2020-12-27 NOTE — ED Provider Notes (Signed)
[facility] DEPT Provider Note   CSN: [id number] Arrival date & time: 12/27/20  1408     History Chief Complaint  Patient presents with   Chest Pain    Holly Gonzalez is a 20 y.o. female who presents for evaluation of chest pain and epigastric pain and MRI abdomen with right hepatic lobe intrahepatic and common bile duct dilatation yesterday. States chest pain is the same pain she has been having since April. Has had elevated liver enzymes since then. Yesterday had MRI abdomen to further evaluate this and showing biliary duct dilations concerning for cholangitis. PCP call her today and advised she come to ED to further evaluation. States she continue to have vomiting about every other day. States she is tolerating liquids and soft foods. Denies fever, chills, diarrhea, dizziness, confusion, or weakness.     Past Medical History:  Diagnosis Date   Allergy     There are no problems to display for this patient.   Past Surgical History:  Procedure Laterality Date   MOUTH SURGERY  2021   tooth extraction     OB History   No obstetric history on file.     Family History  Problem Relation Age of Onset   Diabetes Maternal Aunt    Hyperlipidemia Maternal Aunt    Diabetes Paternal Uncle    Hyperlipidemia Paternal Uncle    Diabetes Maternal Grandmother    Hyperlipidemia Maternal Grandmother    Asthma Neg Hx    Eczema Neg Hx    Allergic Disorder Neg Hx     Social History   Tobacco Use   Smoking status: Never   Smokeless tobacco: Never  Vaping Use   Vaping Use: Never used  Substance Use Topics   Alcohol use: Yes    Alcohol/week: 2.0 standard drinks    Types: 2 Glasses of wine per week   Drug use: No    Home Medications Prior to Admission medications   Medication Sig Start Date End Date Taking? Authorizing Provider  ipratropium (ATROVENT) 0.03 % nasal spray Place 2 sprays into both nostrils 2 (two) times daily. 10/24/20   Kennith Gain, MD  levocetirizine (XYZAL) 5 MG tablet Take 1 tablet (5 mg total) by mouth daily as needed for allergies. 10/24/20   Kennith Gain, MD  omeprazole (PRILOSEC) 20 MG capsule Take 1 capsule (20 mg total) by mouth 2 (two) times daily before a meal. 12/08/20   Montine Circle, PA-C  ondansetron (ZOFRAN ODT) 4 MG disintegrating tablet Take 1 tablet (4 mg total) by mouth every 8 (eight) hours as needed for nausea or vomiting. 12/08/20   Montine Circle, PA-C  oxymetazoline (AFRIN NASAL SPRAY) 0.05 % nasal spray 2 sprays each nostril for no more than 3-5 days at a time. 10/24/20   Kennith Gain, MD  promethazine (PHENERGAN) 12.5 MG tablet Take 1 tablet (12.5 mg total) by mouth every 8 (eight) hours as needed for nausea or vomiting. 12/06/20 12/08/20  Nche, Charlene Brooke, NP    Allergies    Penicillin g  Review of Systems   Review of Systems  Cardiovascular:  Positive for chest pain. Negative for palpitations.  Gastrointestinal:  Positive for abdominal pain, constipation, nausea and vomiting. Negative for diarrhea.  All other systems reviewed and are negative.  Physical Exam Updated Vital Signs BP 101/63 (BP Location: Right Arm)   Pulse 64   Temp 98.2 F (36.8 C) (Oral)   Resp 17   Ht 5' 2"  (1.575  m)   Wt 72.6 kg   LMP 12/23/2020 (Exact Date)   SpO2 100%   BMI 29.26 kg/m   Physical Exam Constitutional:      General: She is not in acute distress.    Appearance: She is well-developed. She is not ill-appearing.  HENT:     Head: Normocephalic and atraumatic.  Eyes:     Extraocular Movements: Extraocular movements intact.     Pupils: Pupils are equal, round, and reactive to light.  Cardiovascular:     Rate and Rhythm: Normal rate and regular rhythm.     Pulses:          Radial pulses are 2+ on the right side and 2+ on the left side.     Heart sounds: Normal heart sounds. No murmur heard.   No friction rub. No gallop.  Pulmonary:     Effort: Pulmonary  effort is normal. No tachypnea or respiratory distress.     Breath sounds: Normal breath sounds.  Abdominal:     General: Bowel sounds are normal. There is no abdominal bruit.     Palpations: Abdomen is soft.     Tenderness: There is abdominal tenderness (epigastric).  Musculoskeletal:     Cervical back: Normal range of motion and neck supple.     Right lower leg: No edema.     Left lower leg: No edema.  Skin:    General: Skin is warm and dry.  Neurological:     General: No focal deficit present.     Mental Status: She is alert and oriented to person, place, and time.  Psychiatric:        Mood and Affect: Mood normal.        Behavior: Behavior normal.    ED Results / Procedures / Treatments   Labs (all labs ordered are listed, but only abnormal results are displayed) Labs Reviewed  COMPREHENSIVE METABOLIC PANEL - Abnormal; Notable for the following components:      Result Value   Total Protein 8.7 (*)    AST 120 (*)    ALT 156 (*)    Alkaline Phosphatase 507 (*)    All other components within normal limits  CBC - Abnormal; Notable for the following components:   MCHC 36.7 (*)    All other components within normal limits  RESP PANEL BY RT-PCR (FLU A&B, COVID) ARPGX2  LIPASE, BLOOD  URINALYSIS, ROUTINE W REFLEX MICROSCOPIC  HEPATITIS B SURFACE ANTIGEN  ANTINUCLEAR ANTIBODIES, IFA  IGG  ANTI-SMOOTH MUSCLE ANTIBODY, IGG  MITOCHONDRIAL ANTIBODIES  I-STAT BETA HCG BLOOD, ED ([facility], [facility], [facility] ONLY)    EKG EKG Interpretation  Date/Time:  Thursday December 27 2020 14:43:27 EDT Ventricular Rate:  74 PR Interval:  138 QRS Duration: 84 QT Interval:  362 QTC Calculation: 402 R Axis:   54 Text Interpretation: Sinus arrhythmia Low voltage, precordial leads 12 Lead; Mason-Likar Confirmed by Quintella Reichert [phone]) on 12/27/2020 8:05:26 PM  Radiology MR Abdomen W Wo Contrast  Result Date: 12/26/2020 CLINICAL DATA:  Abnormal liver function studies. EXAM: MRI ABDOMEN WITHOUT AND WITH  CONTRAST TECHNIQUE: Multiplanar multisequence MR imaging of the abdomen was performed both before and after the administration of intravenous contrast. CONTRAST:  33m MULTIHANCE GADOBENATE DIMEGLUMINE 529 MG/ML IV SOLN COMPARISON:  Abdominal ultrasound 11/29/2020 FINDINGS: Lower chest: The lung bases are clear of an acute process. No pulmonary lesions or pleural or pericardial effusion. Hepatobiliary: No hepatic lesions are identified. There is predominantly right hepatic lobe intrahepatic biliary dilatation. Some  of the ducts appear somewhat irregular and beaded. Findings concerning for cholangitis. The common bile duct is also distended for age measuring approximately 9 mm. No gallstones or common bile duct stones are identified. No worrisome hepatic lesions. There is a benign-appearing left hepatic lobe cyst far laterally. Pancreas:  No mass, inflammation or ductal dilatation. Spleen:  Normal size.  No focal lesions. Adrenals/Urinary Tract: Adrenal glands and kidneys are. No renal lesions or hydronephrosis. Stomach/Bowel: The stomach, duodenum, visualized small bowel and visualized colon are unremarkable. Vascular/Lymphatic: The aorta and branch vessels are patent. The major venous structures are patent. No mesenteric or retroperitoneal mass or adenopathy. Other:  No ascites or abdominal wall hernia. Musculoskeletal: No significant bony findings. IMPRESSION: 1. Right hepatic lobe predominant intrahepatic biliary dilatation and mild common bile duct dilatation for age. Some of the ducts appear somewhat irregular and beaded. Findings concerning for cholangitis. 2. No hepatic lesions. 3. No other significant abdominal findings. Electronically Signed   By: Marijo Sanes M.D.   On: 12/26/2020 15:53    Medications Ordered in ED Medications - No data to display  ED Course  I have reviewed the triage vital signs and the nursing notes.  Pertinent labs & imaging results that were available during my care of the  patient were reviewed by me and considered in my medical decision making (see chart for details).    MDM Rules/Calculators/A&P                         Sadiyah Kangas is a 20 y.o. female who presents for evaluation of chest pain and epigastric pain and MRI abdomen with right hepatic lobe intrahepatic and common bile duct dilatation yesterday. Afebrile, HD stable. No leukocytosis. Symptoms have been stable. CMP with AST 150mALT 156, ALK phos 507 and relatively stable for the past month. Discussed with GI Dr. AHavery Morosdoes not appear to be infectious no indication for antibiotics at this time more suspicious fo autoimmune etiology recommended outpatient follow up and ERCP. Ordered autoimmune and hepatis B labs. Dr. AHavery Morosto help with setting up outpatient GI follow up.  Final Clinical Impression(s) / ED Diagnoses Final diagnoses:  Cholangitis    Rx / DC Orders ED Discharge Orders     None        LIona Beard MD 12/27/20 2251    RQuintella Reichert MD 12/28/20 2257

## 2020-12-27 NOTE — ED Provider Notes (Signed)
Emergency Medicine Provider Triage Evaluation Note  Jaquelyne Firkus , a 20 y.o. female  was evaluated in triage.  Pt complains of chest pain, nv. Sxs have been ongoing for several weeks. She has been w/u multiple times and had an MRCP yesterday which showed findings concerning for cholangitis so she was advised to come to the ED.  Review of Systems  Positive: Chest pain, nv Negative: fever  Physical Exam  BP 104/72 (BP Location: Left Arm)   Pulse 73   Temp 98.3 F (36.8 C) (Oral)   Resp 18   Ht 5\' 2"  (1.575 m)   Wt 72.6 kg   LMP 12/23/2020 (Exact Date)   SpO2 99%   BMI 29.26 kg/m  Gen:   Awake, no distress   Resp:  Normal effort  MSK:   Moves extremities without difficulty  Other:  Abd soft and nontender  Medical Decision Making  Medically screening exam initiated at 2:44 PM.  Appropriate orders placed.  Jeremy Mclamb was informed that the remainder of the evaluation will be completed by another provider, this initial triage assessment does not replace that evaluation, and the importance of remaining in the ED until their evaluation is complete.     Rodney Booze, PA-C 12/27/20 1446    Charlesetta Shanks, MD 01/01/21 2209

## 2020-12-27 NOTE — Discharge Instructions (Addendum)
You were evaluated for abnormalities on you MRI showing inflammation of the bile ducts. This was discussed with the gastroenterologist who review you imaging and laboratory results. At this point it does not appear you have a life threatening infection and we recommend follow up with outpatient GI and will likely need a procedure called an ERCP to help with you symptoms. You should be called to schedule an appointment regarding this. Please call [facility] if you not called regarding this. We have drawn some laboratory studies to help determine what might be causing your symptoms and you GI doctor will review them with you at you visit. Please continue to refrain from alcohol and tylenol in the meantime to prevent liver injury. Please return to the ED if you develop fever, inability to tolerate fluids or food, or experience worsen pain, dizziness, or confusion.

## 2020-12-28 LAB — HEPATITIS B SURFACE ANTIGEN: Hepatitis B Surface Ag: NONREACTIVE

## 2020-12-29 LAB — IGG: IgG (Immunoglobin G), Serum: 1547 mg/dL (ref 586–1602)

## 2020-12-31 ENCOUNTER — Telehealth: Payer: Self-pay

## 2020-12-31 DIAGNOSIS — R112 Nausea with vomiting, unspecified: Secondary | ICD-10-CM

## 2020-12-31 DIAGNOSIS — G8929 Other chronic pain: Secondary | ICD-10-CM

## 2020-12-31 DIAGNOSIS — K8309 Other cholangitis: Secondary | ICD-10-CM

## 2020-12-31 DIAGNOSIS — R7989 Other specified abnormal findings of blood chemistry: Secondary | ICD-10-CM

## 2020-12-31 LAB — MITOCHONDRIAL ANTIBODIES: Mitochondrial M2 Ab, IgG: <20 Units (ref 0.0–20.0)

## 2020-12-31 LAB — ANTI-SMOOTH MUSCLE ANTIBODY, IGG: F-Actin IgG: 28 Units — ABNORMAL HIGH (ref 0–19)

## 2020-12-31 NOTE — Telephone Encounter (Signed)
Armbruster, Carlota Raspberry, MD  Ladene Artist, MD; Milus Banister, MD; Mansouraty, Telford Nab., MD; Leotis Pain; Gatha Mayer, MD; 1 other Thanks all, appreciate the follow up.   Isaih Bulger can you please let this patient know we have reviewed her case after the ED called me last night. Let her know we are recommending an MRCP, and if you could order that for her so it is scheduled that would be great. Amy when you see her in the office can you have her get IgG4 drawn at that time. We will see if she needs ERCP pending MRCP. Thanks   Richardson Landry

## 2020-12-31 NOTE — Telephone Encounter (Signed)
Lm on vm for patient to return call.   Order for MRCP has been entered. Spoke with Kerri Perches at [facility], she states that she has to call patient directly to schedule an appt. She confirmed that they have received the order.

## 2021-01-01 NOTE — Telephone Encounter (Signed)
Lm on vm for patient to return call.   My chart message sent to patient with recommendations as well.

## 2021-01-02 ENCOUNTER — Ambulatory Visit: Payer: Managed Care, Other (non HMO) | Admitting: Physician Assistant

## 2021-01-02 ENCOUNTER — Encounter: Payer: Self-pay | Admitting: Physician Assistant

## 2021-01-02 ENCOUNTER — Other Ambulatory Visit (INDEPENDENT_AMBULATORY_CARE_PROVIDER_SITE_OTHER): Payer: Managed Care, Other (non HMO)

## 2021-01-02 VITALS — BP 80/60 | HR 72 | Ht 63.0 in | Wt 159.4 lb

## 2021-01-02 DIAGNOSIS — R5383 Other fatigue: Secondary | ICD-10-CM

## 2021-01-02 DIAGNOSIS — R1011 Right upper quadrant pain: Secondary | ICD-10-CM

## 2021-01-02 DIAGNOSIS — R112 Nausea with vomiting, unspecified: Secondary | ICD-10-CM

## 2021-01-02 DIAGNOSIS — R945 Abnormal results of liver function studies: Secondary | ICD-10-CM

## 2021-01-02 DIAGNOSIS — R7989 Other specified abnormal findings of blood chemistry: Secondary | ICD-10-CM

## 2021-01-02 DIAGNOSIS — K839 Disease of biliary tract, unspecified: Secondary | ICD-10-CM

## 2021-01-02 DIAGNOSIS — K8301 Primary sclerosing cholangitis: Secondary | ICD-10-CM | POA: Insufficient documentation

## 2021-01-02 LAB — CBC WITH DIFFERENTIAL/PLATELET
Basophils Absolute: 0.1 10*3/uL (ref 0.0–0.1)
Basophils Relative: 1.2 % (ref 0.0–3.0)
Eosinophils Absolute: 0.4 10*3/uL (ref 0.0–0.7)
Eosinophils Relative: 7.4 % — ABNORMAL HIGH (ref 0.0–5.0)
HCT: 39.4 % (ref 36.0–46.0)
Hemoglobin: 14 g/dL (ref 12.0–15.0)
Lymphocytes Relative: 36.4 % (ref 12.0–46.0)
Lymphs Abs: 1.9 10*3/uL (ref 0.7–4.0)
MCHC: 35.6 g/dL (ref 30.0–36.0)
MCV: 92.7 fl (ref 78.0–100.0)
Monocytes Absolute: 0.6 10*3/uL (ref 0.1–1.0)
Monocytes Relative: 12 % (ref 3.0–12.0)
Neutro Abs: 2.2 10*3/uL (ref 1.4–7.7)
Neutrophils Relative %: 43 % (ref 43.0–77.0)
Platelets: 286 10*3/uL (ref 150.0–400.0)
RBC: 4.16 Mil/uL (ref 3.87–5.11)
RDW: 13.3 % (ref 11.5–15.5)
WBC: 5.2 10*3/uL (ref 4.0–10.5)

## 2021-01-02 LAB — HEPATIC FUNCTION PANEL
ALT: 106 U/L — ABNORMAL HIGH (ref 0–35)
AST: 69 U/L — ABNORMAL HIGH (ref 0–37)
Albumin: 4.4 g/dL (ref 3.5–5.2)
Alkaline Phosphatase: 457 U/L — ABNORMAL HIGH (ref 39–117)
Bilirubin, Direct: 0.4 mg/dL — ABNORMAL HIGH (ref 0.0–0.3)
Total Bilirubin: 1.2 mg/dL (ref 0.2–1.2)
Total Protein: 8.2 g/dL (ref 6.0–8.3)

## 2021-01-02 MED ORDER — TRAMADOL HCL 50 MG PO TABS: 50.0000 mg | ORAL_TABLET | Freq: Four times a day (QID) | ORAL | 0 refills | Status: DC | PRN

## 2021-01-02 MED ORDER — PROMETHAZINE HCL 25 MG PO TABS: ORAL_TABLET | ORAL | 1 refills | Status: DC

## 2021-01-02 NOTE — Progress Notes (Signed)
Subjective:    Patient ID: Holly Gonzalez, female    DOB: 01-26-2001, 20 y.o.   MRN: [id number]  HPI Holly Gonzalez is a pleasant 20 year old African-American female, new to GI today referred by Wilfred Lacy NP for evaluation of elevated LFTs, abnormal MRI, and recent onset of illness with nausea vomiting and abdominal pain. Patient has been in generally good health with no known chronic medical problems. She says she had onset of her current symptoms in mid May 2022 with nausea and vomiting which persisted for about 2 weeks.  She says she also did not have any regular bowel movements during that time.  Since then she has developed fatigue and persistent upper abdominal discomfort.  She says she is trying to eat but has not been eating much as she has increased abdominal pain after eating which she describes as crampy and sharp.  She has not had any documented fevers but has been having some night sweats.  She is also describing some intermittent substernal chest pressure.  No heartburn or indigestion no dysphagia or odynophagia.  She has not had any jaundice or dark urine though she does endorse some recent itching. She had been given a prescription for Zofran but says that it is not effective, and actually made her feel worse.  She does not feel well in general over the last couple of weeks and has had some increase in pain gradually. Initial labs on 12/07/2020 showed T bili 1.1/alk phos 498/AST 76/ALT 87, CBC within normal limits She upper abdominal ultrasound 11/29/2020 no gallstones, CBD 2 mm no focal hepatic lesion MRI 12/26/2020 shows predominantly right hepatic lobe intrahepatic biliary dilation some of the ducts appear somewhat irregular and beaded findings concerning for cholangitis, CBD 9 mm, no gallstones or CBD stones, no worrisome hepatic lesions.  Most recent labs 12/28/2018 2T bili 1.2/alk phos 507/AST 120/ALT 156, CBC within normal limits, lipase 25 hepatitis B surface antigen negative, hepatitis  A IgM negative ANA pending, IgG within normal limits, smooth muscle antibody/F-actin elevated at 28 Mitochondrial antibody less than 20  Patient does not have any personal history of autoimmune disease and there is no family history of autoimmune disease that they are aware of.  Review of Systems. Pertinent positive and negative review of systems were noted in the above HPI section.  All other review of systems was otherwise negative.   Outpatient Encounter Medications as of 01/02/2021  Medication Sig   ipratropium (ATROVENT) 0.03 % nasal spray Place 2 sprays into both nostrils 2 (two) times daily.   levocetirizine (XYZAL) 5 MG tablet Take 1 tablet (5 mg total) by mouth daily as needed for allergies.   oxymetazoline (AFRIN NASAL SPRAY) 0.05 % nasal spray 2 sprays each nostril for no more than 3-5 days at a time.   promethazine (PHENERGAN) 25 MG tablet Take 1/2 to 1 tablet by mouth every 6 hours as needed for nausea   [DISCONTINUED] omeprazole (PRILOSEC) 20 MG capsule Take 1 capsule (20 mg total) by mouth 2 (two) times daily before a meal.   [DISCONTINUED] ondansetron (ZOFRAN ODT) 4 MG disintegrating tablet Take 1 tablet (4 mg total) by mouth every 8 (eight) hours as needed for nausea or vomiting.   [DISCONTINUED] promethazine (PHENERGAN) 12.5 MG tablet Take 1 tablet (12.5 mg total) by mouth every 8 (eight) hours as needed for nausea or vomiting.   No facility-administered encounter medications on file as of 01/02/2021.   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   Social History   Socioeconomic History   Marital status: Single    Spouse name: Not on file   Number of children: 0   Years of education: Not on file   Highest education level: Not on file  Occupational History    Comment: Bio-chem and Journalism   Occupation: student  Tobacco Use   Smoking  status: Never   Smokeless tobacco: Never  Vaping Use   Vaping Use: Never used  Substance and Sexual Activity   Alcohol use: Yes    Alcohol/week: 2.0 standard drinks    Types: 2 Glasses of wine per week    Comment: 0-1 per day   Drug use: No   Sexual activity: Yes    Birth control/protection: None  Other Topics Concern   Not on file  Social History Narrative   ** Merged History Encounter **       Social Determinants of Health   Financial Resource Strain: Not on file  Food Insecurity: Not on file  Transportation Needs: Not on file  Physical Activity: Not on file  Stress: Not on file  Social Connections: Not on file  Intimate Partner Violence: Not on file    Ms. Arif's family history includes Colon polyps in her maternal grandmother; Diabetes in her maternal aunt, maternal grandmother, and paternal uncle; Hyperlipidemia in her maternal aunt, maternal grandmother, and paternal uncle; Lymphoma in an other family member; Stomach cancer in an other family member.      Objective:    Vitals:   01/02/21 0844  BP: (!) 80/60  Pulse: 72    Physical Exam Well-developed well-nourished young African-American female in no acute distress.  Accompanied by her mother Weight, 159 BMI 28.2  HEENT; nontraumatic normocephalic, EOMI, PE R LA, sclera anicteric. Oropharynx; not examined today Neck; supple, no JVD Cardiovascular; regular rate and rhythm with S1-S2, no murmur rub or gallop Pulmonary; Clear bilaterally Abdomen; soft, she is tender in the epigastrium and right upper quadrant nondistended, no palpable mass or hepatosplenomegaly, bowel sounds are active Rectal; not done today Skin; benign exam, no jaundice rash or appreciable lesions Extremities; no clubbing cyanosis or edema skin warm and dry Neuro/Psych; alert and oriented x4, grossly nonfocal mood and affect appropriate        Assessment & Plan:   #20 20 year old African-American female with 1 month history of new onset  of initial nausea with vomiting, now persistent nausea, upper abdominal pain worse postprandially, fatigue, intermittent night sweats. She has had persistent elevation of LFTs over the past month,, and MRI shows right hepatic lobe predominant intrahepatic ductal dilation and mild common bile duct dilation, some of the intrahepatic ducts appear irregular and beaded  ANA is pending, F-actin/smooth muscle antibody elevated at 28 antimitochondrial antibody negative  Etiology of illness is not definite at this time.  Concern for primary sclerosing cholangitis or autoimmune cholangiopathy.  Plan; patient is scheduled for MRCP in a Gonzalez days on 01/06/2021. Repeat hepatic panel, CBC today, add IgG4 Await ANA Start Phenergan 25 mg take 1/2 to 1 tablet every 6 hours as needed for nausea Start tramadol 50 mg 1 p.o. every 6 hours as needed for pain Patient and mom advised to monitor for fevers and to call for any temp over 100.  They are also advised to call for any general worsening in condition. We discussed very likely need for ERCP, the  procedure was discussed in detail including indications risks and benefits. Will discuss with biliary team regarding soonest availability for scheduling, and await results of MRCP early next week. Patient will be established with Dr. Hilarie Fredrickson, will CC Dr. Rosalin Hawking Dr. Lilian Coma  coverage next week  Plan  Azriel Jakob Genia Harold PA-C 01/02/2021   Cc: Flossie Buffy, NP

## 2021-01-02 NOTE — Patient Instructions (Addendum)
If you are age 22 or younger, your body mass index should be between 19-25. Your Body mass index is 28.23 kg/m. If this is out of the aformentioned range listed, please consider follow up with your Primary Care Provider.  __________________________________________________________  The [place] [facility] providers would like to encourage you to use [facility] to communicate with providers for non-urgent requests or questions.  Due to long hold times on the telephone, sending your provider a message by [facility] may be a faster and more efficient way to get a response.  Please allow 48 business hours for a response.  Please remember that this is for non-urgent requests.   You have been scheduled for an MRI at [place] on 01/06/2021. Your appointment time is 9:50 am. Please arrive 20 minutes prior to your appointment time for registration purposes. Please make certain not to have anything to eat or drink 6 hours prior to your test. In addition, if you have any metal in your body, have a pacemaker or defibrillator, please be sure to let your ordering physician know. This test typically takes 45 minutes to 1 hour to complete. Should you need to reschedule, please call [phone], option 1 then option 3 to do so.  Your provider has requested that you go to the basement level for lab work before leaving today. Press "B" on the elevator. The lab is located at the first door on the left as you exit the elevator.  We have sent the following medications to your pharmacy: Phenergan 25 mg 1/2- 1 tablet every 6 hours for nausea Tramadol 50 mg 1 tablet every 6 hours as needed for pain.  We will call you about the results and MRCP once we have your results and discuss the next steps.  Call the office if you get a fever, ask for [facility] or [place].  Thank you for entrusting me with your care and choosing [facility].  Amy Esterwood, PA-C

## 2021-01-02 NOTE — Progress Notes (Signed)
Addendum: Reviewed and agree with assessment and management plan. Concern for probable PSC  While I am more than happy to accept this patient to my practice, it may be more appropriate for her to establish with one of my biliary colleagues with the ability to perform ERCP when and if needed in the future.  I think it is quite possible that after MRCP ERCP will be needed to exclude a dominant stricture. MRCP is pending for next week  Other recommendations include: Checking IgG4 Ensuring that she has a bone mineral density test; she may need calcium and vitamin D supplementation Annual gallbladder ultrasound She needs colonoscopy to exclude concomitant IBD given the high association of PSC and IBD.  Hazley Dezeeuw, Lajuan Lines, MD

## 2021-01-04 LAB — ANTINUCLEAR ANTIBODIES, IFA: ANA Ab, IFA: NEGATIVE

## 2021-01-06 ENCOUNTER — Ambulatory Visit
Admission: RE | Admit: 2021-01-06 | Discharge: 2021-01-06 | Disposition: A | Discharge status: Home / Self Care | Payer: Managed Care, Other (non HMO) | Source: Ambulatory Visit | Attending: Gastroenterology | Admitting: Gastroenterology

## 2021-01-06 ENCOUNTER — Other Ambulatory Visit: Payer: Self-pay

## 2021-01-06 DIAGNOSIS — R7989 Other specified abnormal findings of blood chemistry: Secondary | ICD-10-CM

## 2021-01-06 DIAGNOSIS — R112 Nausea with vomiting, unspecified: Secondary | ICD-10-CM

## 2021-01-06 DIAGNOSIS — G8929 Other chronic pain: Secondary | ICD-10-CM

## 2021-01-06 DIAGNOSIS — K8309 Other cholangitis: Secondary | ICD-10-CM

## 2021-01-06 MED ORDER — GADOBENATE DIMEGLUMINE 529 MG/ML IV SOLN
20.0000 mL | Freq: Once | INTRAVENOUS | Status: AC | PRN
  Administered 2021-01-06: 20 mL via INTRAVENOUS

## 2021-01-09 ENCOUNTER — Other Ambulatory Visit: Payer: Self-pay

## 2021-01-09 DIAGNOSIS — K8301 Primary sclerosing cholangitis: Secondary | ICD-10-CM

## 2021-01-09 DIAGNOSIS — R7989 Other specified abnormal findings of blood chemistry: Secondary | ICD-10-CM

## 2021-01-09 DIAGNOSIS — K8309 Other cholangitis: Secondary | ICD-10-CM

## 2021-01-09 LAB — FILARIA ANTIBODY (IGG4): Filaria Antibody (IgG4): 0.83 INDEX

## 2021-01-09 MED ORDER — CIPROFLOXACIN HCL 500 MG PO TABS: 500.0000 mg | ORAL_TABLET | Freq: Two times a day (BID) | ORAL | 0 refills | Status: AC

## 2021-01-10 ENCOUNTER — Other Ambulatory Visit: Payer: Self-pay

## 2021-01-10 ENCOUNTER — Ambulatory Visit (INDEPENDENT_AMBULATORY_CARE_PROVIDER_SITE_OTHER)
Admission: RE | Admit: 2021-01-10 | Discharge: 2021-01-10 | Disposition: A | Discharge status: Home / Self Care | Payer: Managed Care, Other (non HMO) | Source: Ambulatory Visit | Attending: Physician Assistant | Admitting: Physician Assistant

## 2021-01-10 ENCOUNTER — Other Ambulatory Visit: Payer: Managed Care, Other (non HMO)

## 2021-01-10 DIAGNOSIS — K8301 Primary sclerosing cholangitis: Secondary | ICD-10-CM

## 2021-01-13 LAB — IGG 4: IgG, Subclass 4: 10 mg/dL (ref 2–96)

## 2021-01-17 ENCOUNTER — Ambulatory Visit (INDEPENDENT_AMBULATORY_CARE_PROVIDER_SITE_OTHER): Payer: Managed Care, Other (non HMO) | Admitting: Physician Assistant

## 2021-01-17 ENCOUNTER — Encounter: Payer: Self-pay | Admitting: Physician Assistant

## 2021-01-17 ENCOUNTER — Other Ambulatory Visit (INDEPENDENT_AMBULATORY_CARE_PROVIDER_SITE_OTHER): Payer: Managed Care, Other (non HMO)

## 2021-01-17 VITALS — BP 110/60 | HR 71 | Ht 63.0 in | Wt 161.0 lb

## 2021-01-17 DIAGNOSIS — K8301 Primary sclerosing cholangitis: Secondary | ICD-10-CM

## 2021-01-17 DIAGNOSIS — R7989 Other specified abnormal findings of blood chemistry: Secondary | ICD-10-CM

## 2021-01-17 DIAGNOSIS — R1011 Right upper quadrant pain: Secondary | ICD-10-CM

## 2021-01-17 DIAGNOSIS — R194 Change in bowel habit: Secondary | ICD-10-CM

## 2021-01-17 LAB — CBC WITH DIFFERENTIAL/PLATELET
Basophils Absolute: 0.1 10*3/uL (ref 0.0–0.1)
Basophils Relative: 1.2 % (ref 0.0–3.0)
Eosinophils Absolute: 0.6 10*3/uL (ref 0.0–0.7)
Eosinophils Relative: 10.4 % — ABNORMAL HIGH (ref 0.0–5.0)
HCT: 37 % (ref 36.0–46.0)
Hemoglobin: 13.1 g/dL (ref 12.0–15.0)
Lymphocytes Relative: 39.4 % (ref 12.0–46.0)
Lymphs Abs: 2.1 10*3/uL (ref 0.7–4.0)
MCHC: 35.5 g/dL (ref 30.0–36.0)
MCV: 92.3 fl (ref 78.0–100.0)
Monocytes Absolute: 0.8 10*3/uL (ref 0.1–1.0)
Monocytes Relative: 15.1 % — ABNORMAL HIGH (ref 3.0–12.0)
Neutro Abs: 1.8 10*3/uL (ref 1.4–7.7)
Neutrophils Relative %: 33.9 % — ABNORMAL LOW (ref 43.0–77.0)
Platelets: 247 10*3/uL (ref 150.0–400.0)
RBC: 4.01 Mil/uL (ref 3.87–5.11)
RDW: 13.1 % (ref 11.5–15.5)
WBC: 5.5 10*3/uL (ref 4.0–10.5)

## 2021-01-17 LAB — COMPREHENSIVE METABOLIC PANEL
ALT: 113 U/L — ABNORMAL HIGH (ref 0–35)
AST: 84 U/L — ABNORMAL HIGH (ref 0–37)
Albumin: 4.3 g/dL (ref 3.5–5.2)
Alkaline Phosphatase: 407 U/L — ABNORMAL HIGH (ref 39–117)
BUN: 7 mg/dL (ref 6–23)
CO2: 27 mEq/L (ref 19–32)
Calcium: 9.4 mg/dL (ref 8.4–10.5)
Chloride: 101 mEq/L (ref 96–112)
Creatinine, Ser: 0.67 mg/dL (ref 0.40–1.20)
GFR: 124.48 mL/min (ref >60.00)
Glucose, Bld: 89 mg/dL (ref 70–99)
Potassium: 3.8 mEq/L (ref 3.5–5.1)
Sodium: 135 mEq/L (ref 135–145)
Total Bilirubin: 1.3 mg/dL — ABNORMAL HIGH (ref 0.2–1.2)
Total Protein: 7.9 g/dL (ref 6.0–8.3)

## 2021-01-17 MED ORDER — NA SULFATE-K SULFATE-MG SULF 17.5-3.13-1.6 GM/177ML PO SOLN: 1.0000 | Freq: Once | ORAL | 0 refills | Status: AC

## 2021-01-17 MED ORDER — PROMETHAZINE HCL 25 MG PO TABS: ORAL_TABLET | ORAL | 1 refills | Status: AC

## 2021-01-17 NOTE — Patient Instructions (Addendum)
If you are age 22 or younger, your body mass index should be between 19-25. Your Body mass index is 28.52 kg/m. If this is out of the aformentioned range listed, please consider follow up with your Primary Care Provider.  __________________________________________________________  The [place] [facility] providers would like to encourage you to use [facility] to communicate with providers for non-urgent requests or questions.  Due to long hold times on the telephone, sending your provider a message by [facility] may be a faster and more efficient way to get a response.  Please allow 48 business hours for a response.  Please remember that this is for non-urgent requests.   You have been scheduled for a colonoscopy. Please follow written instructions given to you at your visit today.  Please pick up your prep supplies at the pharmacy within the next 1-3 days. If you use inhalers (even only as needed), please bring them with you on the day of your procedure.  Your provider has requested that you go to the basement level for lab work before leaving today. Press "B" on the elevator. The lab is located at the first door on the left as you exit the elevator.  Complete your course of Cipro  We have sent refills of your Promethazine and Tramadol to your pharmacy  Start a Multivitamin with Vitamin D.  We will work to get you set up with a Hepatologist at the [facility]. You will be able to call them yourself at [phone].  Thank you for entrusting me with your care and choosing [facility].  Amy Esterwood, PA-C

## 2021-01-20 MED ORDER — TRAMADOL HCL 50 MG PO TABS: 50.0000 mg | ORAL_TABLET | Freq: Four times a day (QID) | ORAL | 0 refills | Status: DC | PRN

## 2021-01-20 NOTE — Progress Notes (Signed)
Subjective:    Patient ID: Holly Gonzalez, female    DOB: 07-24-2000, 20 y.o.   MRN: [id number]  HPI Holly Gonzalez is a pleasant 20 year old African-American female, now established with Dr. Hilarie Fredrickson who comes in today for follow-up.  She was initially seen here in consultation on 01/02/2021 with elevated LFTs, abnormal MRI and recent onset of illness with nausea vomiting and abdominal pain. She has since undergone MRCP, showing no hepatic steatosis is a 1 cm cyst in the left lobe of the liver and subtle irregularity of the hepatic parenchyma involving the right with over the liver with mild caudate lobe hypertrophy, gallbladder unremarkable similar right-sided predominant intra and extrahepatic biliary ductal dilation with a slight irregularity beaded appearance of the ducts Common bile duct is slightly enlarged but decreased in size in comparison now measuring 7 mm, previously 9 mm there is subtle peribiliary enhancement predominantly involving the ducts of the right lobe of the liver findings suspicious for early changes of primary sclerosing cholangitis. Labs on 01/02/2021 showed T bili 1.2/alk phos 457/AST 69/ALT 106 WBC 5.2 Hemoglobin 14 hematocrit 39.4 IgG4 within normal limits ANA negative Anti-smooth muscle antibody/F-actin positive at 28 DEXA scan within normal limits  Patient has been on a course of Cipro over the past week.  She says she cannot really tell any difference in her symptoms since starting the antibiotics.  She has been using tramadol at night to help with pain and has Zofran to use as needed.  She has not had any vomiting over the past couple of days.  No fevers or chills.  She has noted some headache off and on.  She has noticed some increased frequency of bowel movements now occurring after most meals.  She has been trying to push fluids, feels that her urine still looks a little bit dark and has noticed some crampy discomfort in the right upper quadrant off and on and some  intermittent left lower abdominal discomfort.  She had mentioned last time that she was having some pruritus, no real change.  She relates today that she has accepted a new job and will be relocating to [place] towards the end of July.  Review of Systems.Pertinent positive and negative review of systems were noted in the above HPI section.  All other review of systems was otherwise negative.   Outpatient Encounter Medications as of 01/17/2021  Medication Sig   [EXPIRED] ciprofloxacin (CIPRO) 500 MG tablet Take 1 tablet (500 mg total) by mouth 2 (two) times daily for 10 days.   ipratropium (ATROVENT) 0.03 % nasal spray Place 2 sprays into both nostrils 2 (two) times daily.   levocetirizine (XYZAL) 5 MG tablet Take 1 tablet (5 mg total) by mouth daily as needed for allergies.   [EXPIRED] Na Sulfate-K Sulfate-Mg Sulf 17.5-3.13-1.6 GM/177ML SOLN Take 1 kit by mouth once for 1 dose.   oxymetazoline (AFRIN NASAL SPRAY) 0.05 % nasal spray 2 sprays each nostril for no more than 3-5 days at a time.   traMADol (ULTRAM) 50 MG tablet Take 1 tablet (50 mg total) by mouth every 6 (six) hours as needed.   [DISCONTINUED] promethazine (PHENERGAN) 25 MG tablet Take 1/2 to 1 tablet by mouth every 6 hours as needed for nausea   promethazine (PHENERGAN) 25 MG tablet Take 1/2 to 1 tablet by mouth every 6 hours as needed for nausea   No facility-administered encounter medications on file as of 01/17/2021.   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   Social History   Socioeconomic History   Marital status: Single    Spouse name: Not on file   Number of children: 0   Years of education: Not on file   Highest education level: Not on file  Occupational History    Comment: Bio-chem and Journalism   Occupation: student  Tobacco Use   Smoking status: Never   Smokeless tobacco: Never   Vaping Use   Vaping Use: Never used  Substance and Sexual Activity   Alcohol use: Yes    Alcohol/week: 2.0 standard drinks    Types: 2 Glasses of wine per week    Comment: 0-1 per day   Drug use: No   Sexual activity: Yes    Birth control/protection: None  Other Topics Concern   Not on file  Social History Narrative   ** Merged History Encounter **       Social Determinants of Health   Financial Resource Strain: Not on file  Food Insecurity: Not on file  Transportation Needs: Not on file  Physical Activity: Not on file  Stress: Not on file  Social Connections: Not on file  Intimate Partner Violence: Not on file    Ms. Berrios's family history includes Colon polyps in her maternal grandmother; Diabetes in her maternal aunt, maternal grandmother, and paternal uncle; Hyperlipidemia in her maternal aunt, maternal grandmother, and paternal uncle; Lymphoma in an other family member; Stomach cancer in an other family member.      Objective:    Vitals:   01/17/21 1332  BP: 110/60  Pulse: 71  SpO2: 98%    Physical Exam. Well-developed well-nourished young African-American female in no acute distress.  Pleasant, patient here alone , Weight, 161 BMI 28.5  HEENT; nontraumatic normocephalic, EOMI, PE R LA, sclera anicteric. Oropharynx; not examined Neck; supple, no JVD Cardiovascular; regular rate and rhythm with S1-S2, no murmur rub or gallop Pulmonary; Clear bilaterally Abdomen; soft, there is some mild tenderness in the right upper quadrant and also in the left lower quadrant ,nondistended, no palpable mass or hepatosplenomegaly, bowel sounds are active Rectal; not done Skin; benign exam, no jaundice rash or appreciable lesions Extremities; no clubbing cyanosis or edema skin warm and dry Neuro/Psych; alert and oriented x4, grossly nonfocal mood and affect appropriate        Assessment & Plan:   #22 20 year old African-American female 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  Patient continues to have symptoms of right upper quadrant abdominal discomfort, intermittent nausea and vomiting which has been occurring less often, and mild pruritus. No change in symptoms thus far with empiric course of Cipro.  Patient is now noticing some increased frequency of bowel movements and mild left lower quadrant discomfort, etiology not clear, she is scheduled for upcoming colonoscopy to rule out concurrent ulcerative colitis.  #2 normal DEXA scan  Plan; complete the current course of Cipro Refill tramadol 50 mg every 6 to 8 hours as needed, she is primarily using at bedtime Refill Phenergan 12.5 to 25 mg every 6 hours as needed for nausea Proceed with colonoscopy which is already scheduled with Dr. Hilarie Fredrickson. CBC with differential and hepatic panel today Will asked Dr. Mansouraty/Biliary to review labs and MRCP and advise regarding any need for ERCP in the upcoming weeks.  Patient will be relocating to [place],  she was given the number for the hepatology clinic today at [facility].  Will also normally try to get her referred there so she will have prompt follow-up.    Neelah Mannings S Shaunika Italiano PA-C 01/20/2021   Cc: Flossie Buffy, NP

## 2021-01-22 ENCOUNTER — Other Ambulatory Visit: Payer: Self-pay

## 2021-01-22 ENCOUNTER — Ambulatory Visit (AMBULATORY_SURGERY_CENTER): Payer: Managed Care, Other (non HMO) | Admitting: Internal Medicine

## 2021-01-22 ENCOUNTER — Encounter: Payer: Self-pay | Admitting: Internal Medicine

## 2021-01-22 ENCOUNTER — Other Ambulatory Visit: Payer: Self-pay | Admitting: Internal Medicine

## 2021-01-22 VITALS — BP 110/74 | HR 72 | Temp 98.2°F | Resp 15 | Ht 63.0 in | Wt 161.0 lb

## 2021-01-22 DIAGNOSIS — D127 Benign neoplasm of rectosigmoid junction: Secondary | ICD-10-CM

## 2021-01-22 DIAGNOSIS — K635 Polyp of colon: Secondary | ICD-10-CM | POA: Diagnosis not present

## 2021-01-22 DIAGNOSIS — K8301 Primary sclerosing cholangitis: Secondary | ICD-10-CM

## 2021-01-22 MED ORDER — SODIUM CHLORIDE 0.9 % IV SOLN: 500.0000 mL | Freq: Once | INTRAVENOUS | Status: DC

## 2021-01-22 MED ORDER — HYDROCODONE-ACETAMINOPHEN 5-300 MG PO TABS: 1.0000 | ORAL_TABLET | Freq: Four times a day (QID) | ORAL | 0 refills | Status: DC | PRN

## 2021-01-22 NOTE — Op Note (Signed)
[place] Patient Name: Holly Gonzalez Procedure Date: 01/22/2021 9:08 AM MRN: [id number] Endoscopist: Jerene Bears , MD Age: 20 Referring MD:  Date of Birth: 11-15-2000 Gender: Female Account #: [id number] Procedure:                Colonoscopy Indications:              Primary sclerosing cholangitis (recent diagnosis);                            initial colonoscopy Medicines:                Monitored Anesthesia Care Procedure:                Pre-Anesthesia Assessment:                           - Prior to the procedure, a History and Physical                            was performed, and patient medications and                            allergies were reviewed. The patient's tolerance of                            previous anesthesia was also reviewed. The risks                            and benefits of the procedure and the sedation                            options and risks were discussed with the patient.                            All questions were answered, and informed consent                            was obtained. Prior Anticoagulants: The patient has                            taken no previous anticoagulant or antiplatelet                            agents. ASA Grade Assessment: II - A patient with                            mild systemic disease. After reviewing the risks                            and benefits, the patient was deemed in                            satisfactory condition to undergo the procedure.  After obtaining informed consent, the colonoscope                            was passed under direct vision. Throughout the                            procedure, the patient's blood pressure, pulse, and                            oxygen saturations were monitored continuously. The                            Olympus PCF-H190DL (#[id number]) Colonoscope was                            introduced through the anus and advanced to  the                            cecum, identified by the appendiceal orifice. The                            colonoscopy was performed without difficulty. The                            patient tolerated the procedure well. The quality                            of the bowel preparation was poor. Copious                            irrigation and lavage performed which improved                            limited visibility. The terminal ileum, ileocecal                            valve, appendiceal orifice, and rectum were                            photographed. Scope In: 9:29:10 AM Scope Out: 9:46:33 AM Scope Withdrawal Time: 0 hours 15 minutes 14 seconds  Total Procedure Duration: 0 hours 17 minutes 23 seconds  Findings:                 The digital rectal exam was normal.                           A localized area of mucosa in the terminal ileum                            was mildly erythematous. No ulcerations or                            erosions. No convincing evidence for Crohn's. This  was biopsied with a cold forceps for histology.                           A 4 mm polyp was found in the recto-sigmoid colon.                            The polyp was sessile. The polyp was removed with a                            cold snare. Resection and retrieval were complete.                           Normal mucosa was found in the entire colon, though                            complete visualization was limited by prep. No                            evidence seen today for IBD. Biopsies were taken                            with a cold forceps for histology (Jar 2 right                            colon, Jar 3 left colon, Jar 4 rectum).                           The retroflexed view of the distal rectum and anal                            verge was normal and showed no anal or rectal                            abnormalities. Complications:            No immediate  complications. Estimated Blood Loss:     Estimated blood loss was minimal. Impression:               - Preparation of the colon was poor clearing to                            fair with copious irrigation and lavage.                           - Mildly erythematous mucosa in the terminal ileum.                            Biopsied.                           - One 4 mm polyp at the recto-sigmoid colon,                            removed with  a cold snare. Resected and retrieved.                           - Normal mucosa in the entire examined colon.                            Colitis not seen today. Biopsied as above.                           - The distal rectum and anal verge are normal on                            retroflexion view. Recommendation:           - Patient has a contact number available for                            emergencies. The signs and symptoms of potential                            delayed complications were discussed with the                            patient. Return to normal activities tomorrow.                            Written discharge instructions were provided to the                            patient.                           - Resume previous diet.                           - Continue present medications.                           - Await pathology results.                           - Repeat colonoscopy is recommended. Given [place] if                            no IBD then repeat colonoscopy would be recommended                            in 3-5 years, however with prep limitation today,                            consider repeat colonoscopy in 1 year. The                            colonoscopy date will be determined after pathology  results from today's exam become available for                            review.                           - I will discuss ERCP with biliary colleagues. Jerene Bears, MD 01/22/2021 9:56:09 AM This  report has been signed electronically.

## 2021-01-22 NOTE — Progress Notes (Signed)
A/ox3, pleased with MAC, report to RN 

## 2021-01-22 NOTE — Progress Notes (Signed)
Addendum: Reviewed and agree with assessment and management plan. Patient came for colonoscopy today; somewhat limited by inadequate preparation though no evidence for ulcerative colitis was seen.  Colonic biopsies were performed.  We will await pathology results I will discuss case with Dr. Rush Landmark and/or Dr. Ardis Hughs for their opinion regarding ERCP.  She is having intermittent upper abdominal pain, nausea and vomiting.  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 She tried tramadol but this caused her to have depressed mood, apathy and sleepiness.  It did however help her pain.  She asked for a possible different pain medication.  I will try her on hydrocodone/acetaminophen 5-300 mg 1 tablet every 6 hours as needed for severe pain.  I let her know that this is a narcotic and can have similar side effects.  She is instructed to call me if this medication causes side effects.  She will use this sparingly. She also will be moving to [facility] but will ask her employer, [employer], to delay her appointment until September 1.  She does not think this will be a problem.  I will write a work note for her. Also discussed referral to [facility] hepatology specialist in [place] and received the recommendation for Dr. Colin Rhein, MD.  Referral needs to be made given her pending move to [place].   Holly Gonzalez, Lajuan Lines, MD

## 2021-01-22 NOTE — Patient Instructions (Signed)
YOU HAD AN ENDOSCOPIC PROCEDURE TODAY AT THE [facility]:   Refer to the procedure report that was given to you for any specific questions about what was found during the examination.  If the procedure report does not answer your questions, please call your gastroenterologist to clarify.  If you requested that your care partner not be given the details of your procedure findings, then the procedure report has been included in a sealed envelope for you to review at your convenience later.  YOU SHOULD EXPECT: Some feelings of bloating in the abdomen. Passage of more gas than usual.  Walking can help get rid of the air that was put into your GI tract during the procedure and reduce the bloating. If you had a lower endoscopy (such as a colonoscopy or flexible sigmoidoscopy) you may notice spotting of blood in your stool or on the toilet paper. If you underwent a bowel prep for your procedure, you may not have a normal bowel movement for a few days.  Please Note:  You might notice some irritation and congestion in your nose or some drainage.  This is from the oxygen used during your procedure.  There is no need for concern and it should clear up in a day or so.  SYMPTOMS TO REPORT IMMEDIATELY:   Following lower endoscopy (colonoscopy or flexible sigmoidoscopy):  Excessive amounts of blood in the stool  Significant tenderness or worsening of abdominal pains  Swelling of the abdomen that is new, acute  Fever of 100F or higher   Following upper endoscopy (EGD)  Vomiting of blood or coffee ground material  New chest pain or pain under the shoulder blades  Painful or persistently difficult swallowing  New shortness of breath  Fever of 100F or higher  Black, tarry-looking stools  For urgent or emergent issues, a gastroenterologist can be reached at any hour by calling [phone]. Do not use MyChart messaging for urgent concerns.    DIET:  We do recommend a small meal at first, but  then you may proceed to your regular diet.  Drink plenty of fluids but you should avoid alcoholic beverages for 24 hours.  ACTIVITY:  You should plan to take it easy for the rest of today and you should NOT DRIVE or use heavy machinery until tomorrow (because of the sedation medicines used during the test).    FOLLOW UP: Our staff will call the number listed on your records 48-72 hours following your procedure to check on you and address any questions or concerns that you may have regarding the information given to you following your procedure. If we do not reach you, we will leave a message.  We will attempt to reach you two times.  During this call, we will ask if you have developed any symptoms of COVID 19. If you develop any symptoms (ie: fever, flu-like symptoms, shortness of breath, cough etc.) before then, please call [phone].  If you test positive for Covid 19 in the 2 weeks post procedure, please call and report this information to us.    If any biopsies were taken you will be contacted by phone or by letter within the next 1-3 weeks.  Please call us at [phone] if you have not heard about the biopsies in 3 weeks.    SIGNATURES/CONFIDENTIALITY: You and/or your care partner have signed paperwork which will be entered into your electronic medical record.  These signatures attest to the fact that that the information above on   your After Visit Summary has been reviewed and is understood.  Full responsibility of the confidentiality of this discharge information lies with you and/or your care-partner. 

## 2021-01-22 NOTE — Progress Notes (Signed)
Called to room to assist during endoscopic procedure.  Patient ID and intended procedure confirmed with present staff. Received instructions for my participation in the procedure from the performing physician.  

## 2021-01-22 NOTE — Progress Notes (Signed)
Pt's states no medical or surgical changes since previsit or office visit. 

## 2021-01-23 ENCOUNTER — Other Ambulatory Visit: Payer: Self-pay

## 2021-01-23 ENCOUNTER — Telehealth: Payer: Self-pay

## 2021-01-23 ENCOUNTER — Other Ambulatory Visit: Payer: Self-pay | Admitting: Internal Medicine

## 2021-01-23 DIAGNOSIS — K8301 Primary sclerosing cholangitis: Secondary | ICD-10-CM

## 2021-01-23 MED ORDER — HYDROCODONE-ACETAMINOPHEN 5-325 MG PO TABS: 1.0000 | ORAL_TABLET | Freq: Four times a day (QID) | ORAL | 0 refills | Status: AC | PRN

## 2021-01-23 NOTE — Telephone Encounter (Signed)
-----   Message from Alfredia Ferguson, PA-C sent at 01/20/2021  1:58 PM EDT ----- Regarding: Referral to [facility], this patient just recently established with [place] and now has a new diagnosis of primary sclerosing cholangitis. She is relocating to [place] at the end of the month for a new job.  We want to get her referred to [facility] hepatology clinic for ongoing management of the new diagnosis of primary sclerosing cholangitis. Can you please see if you can get her referred and fax all of her records.  She has a colonoscopy upcoming this week so we may want to wait until we have that result to send as well. She really needs to be seen at the beginning of August if at all possible.  Please let me know if you are able to get her referred, thank you

## 2021-01-23 NOTE — Telephone Encounter (Signed)
Records and the referral form faxed to [facility] and Transplant team. They will contact the patient to schedule.

## 2021-01-24 ENCOUNTER — Other Ambulatory Visit: Payer: Self-pay

## 2021-01-24 ENCOUNTER — Telehealth: Payer: Self-pay

## 2021-01-24 NOTE — Telephone Encounter (Signed)
  Follow up Call-  Call back number 01/22/2021  Post procedure Call Back phone  # [phone]  Permission to leave phone message Yes  Some recent data might be hidden     Patient questions:  Do you have a fever, pain , or abdominal swelling? No. Pain Score  0 *  Have you tolerated food without any problems? Yes.    Have you been able to return to your normal activities? Yes.    Do you have any questions about your discharge instructions: Diet   No. Medications  No. Follow up visit  No.  Do you have questions or concerns about your Care? No.  Actions: * If pain score is 4 or above: No action needed, pain <4.  Have you developed a fever since your procedure? No   2.   Have you had an respiratory symptoms (SOB or cough) since your procedure? No   3.   Have you tested positive for COVID 19 since your procedure no   4.   Have you had any family members/close contacts diagnosed with the COVID 19 since your procedure?  No    If yes to any of these questions please route to Joylene John, RN and Joella Prince, RN

## 2021-01-28 NOTE — Telephone Encounter (Signed)
Dr Hilarie Fredrickson changed who she was getting referred to. Holly Gonzalez has taken it from here. She was aware of the referral yes.

## 2021-01-29 NOTE — Progress Notes (Signed)
Referral and records faxed to [facility] for appt after 03/21/21, faxed to [phone].Requested appt with Dr. Colin Rhein.

## 2021-01-30 ENCOUNTER — Encounter: Payer: Self-pay | Admitting: Internal Medicine

## 2021-02-15 ENCOUNTER — Telehealth: Payer: Self-pay | Admitting: *Deleted

## 2021-02-15 NOTE — Telephone Encounter (Signed)
-----   Message from Jerene Bears, MD sent at 02/15/2021  3:55 PM EDT ----- Want to be sure the back and forth regarding [place] management is documented in her chart JMP ----- Message ----- From: Criselda Peaches, MD Sent: 01/28/2021  10:51 AM EDT To: Alfredia Ferguson, PA-C, Milus Banister, MD, #  Gabe,  After reviewing the MRCP, there's mild dilation within segmental ducts on both the right and left.  Overall the pattern is one of multifocal beading and segmental areas of stenosis. There's not a dominant area of stricturing associated with a greater level of dilation to target in my opinion.   Thank you for sharing.  This is a tough situation for this young woman.   Heath    ----- Message ----- From: Milus Banister, MD Sent: 01/25/2021   8:08 AM EDT To: Alfredia Ferguson, PA-C, Sandi Mariscal, MD, #  Gabe, I have to admit that I do not have extensive experience with [facility] and so I favor referral to dedicated hepatologist to help direct this decision. If that is not feasible for patient preference or otherwise then I would observe her liver tests a bit longer before committing to invasive procedures.  I think in that case if her LFTs show a clear trend upwards then ERCP with stricture brushing and balloon dilation seems reasonable.   DJ    ----- Message ----- From: Irving Copas., MD Sent: 01/25/2021  12:07 AM EDT To: Alfredia Ferguson, PA-C, Milus Banister, MD, #  JMP and AE, thanks for sending this.  I wanted to place a couple of our IR colleagues on here, and see their take as well as DJ.  Ulice Dash and Myrle Sheng, I hope you are both doing well.  This is a young 32F patient that has been clinically and radiographically diagnosed with [place] based on LFT pattern and MRI imaging.   The clinical question is whether you all see a significant enough stricture in the RHD that is leading to the right intrahepatic dilitation that is being called.  If so, then I think putting her through an ERCP attempt  to perform sphincterotomy and see if we can do a balloon dilation would be worth it.  Otherwise, we don't want to do a sphincterotomy and start mucking around in there since we will increase risk of ascending cholangitis otherwise in the future.  Appreciate your help as always.  Gabe  ----- Message ----- From: Jerene Bears, MD Sent: 01/22/2021   5:40 PM EDT To: Alfredia Ferguson, PA-C, #     ----- Message ----- From: Alfredia Ferguson, PA-C Sent: 01/20/2021   1:58 PM EDT To: Jerene Bears, MD

## 2021-07-16 IMAGING — MR MR ABDOMEN WO/W CM MRCP
12 of 18 series · 26 of 48 positions shown · IV contrast (multihance)
Comparison: MRI abdomen December 26, 2020

CLINICAL DATA: Epigastric pain, nausea, vomiting, elevated LFTs.

EXAM:
MRI ABDOMEN WITHOUT AND WITH CONTRAST (INCLUDING MRCP)
TECHNIQUE: Multiplanar multisequence MR imaging of the abdomen was performed
both before and after the administration of intravenous contrast.
Heavily T2-weighted images of the biliary and pancreatic ducts were
obtained, and three-dimensional MRCP images were rendered by post
processing.
CONTRAST:  20mL MULTIHANCE GADOBENATE DIMEGLUMINE 529 MG/ML IV SOLN

[Series 2: cor haste · coronal · 5.0mm · 0.74mm/px · 2 of 31 slices shown]
[im 1/31]
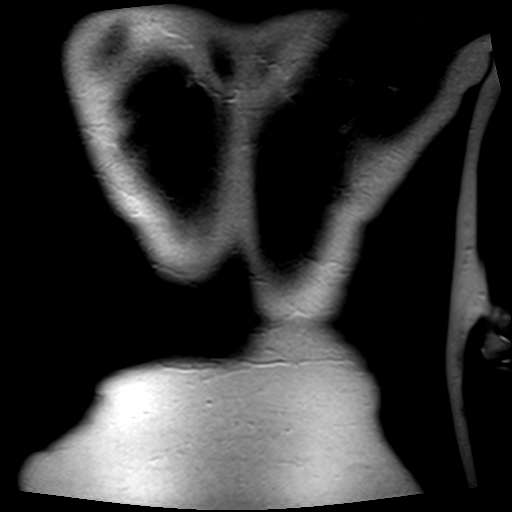
[im 31/31]
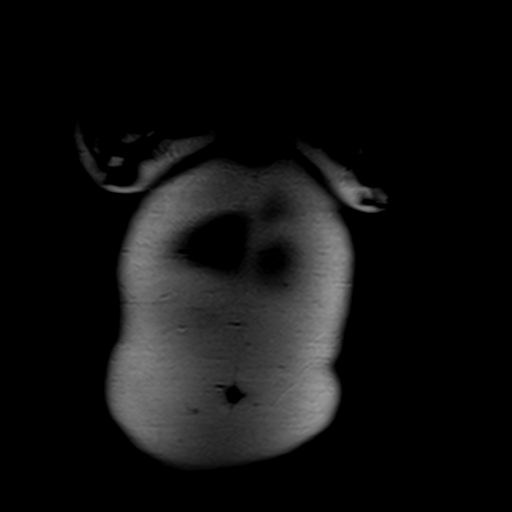

[Series 3: axial haste · axial · 6.0mm · 0.78mm/px · z∈[-86,+138]mm · 2 of 35 slices shown]
[im 1/35]
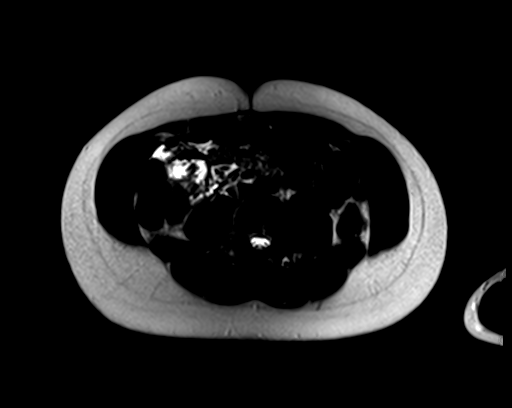
[im 35/35]
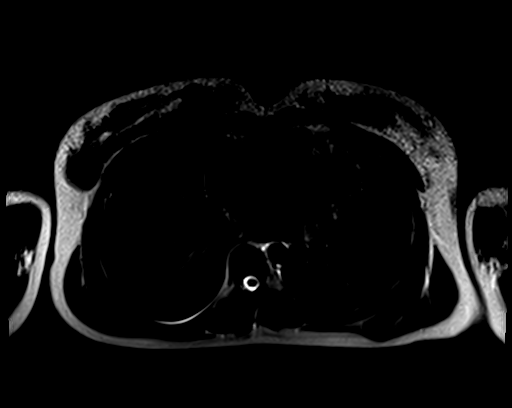

[Series 6: MRCP · coronal · 0.99mm/px · 1 of 4 slices shown]
[im 1/4]
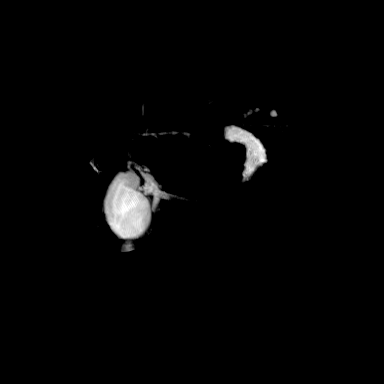

[Series 8: T2 · axial · 6.0mm · 1.12mm/px · 1 of 30 slices shown (1 of 2)]
[im 1/30]
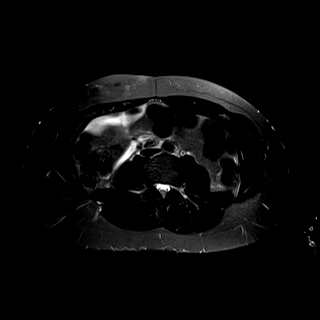

[Series 9: [id number]_trig · axial · 6.0mm · 1.98mm/px · z∈[-78,+130]mm · 4 of 90 slices shown]
[im 1/90]
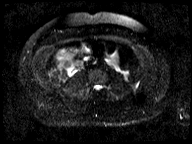
[im 30/90]
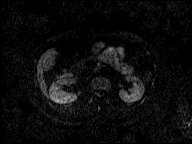
[im 60/90]
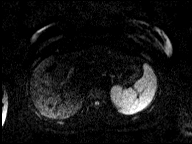
[im 90/90]
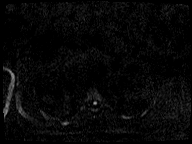

[Series 10: [id number]_trig_adc · axial · 6.0mm · 1.98mm/px · 1 of 30 slices shown]
[im 1/30]
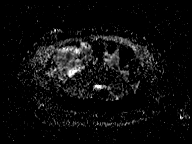

[Series 13: bSSFP · coronal · 5.0mm · 0.78mm/px · 1 of 31 slices shown (1 of 2)]
[im 1/31]
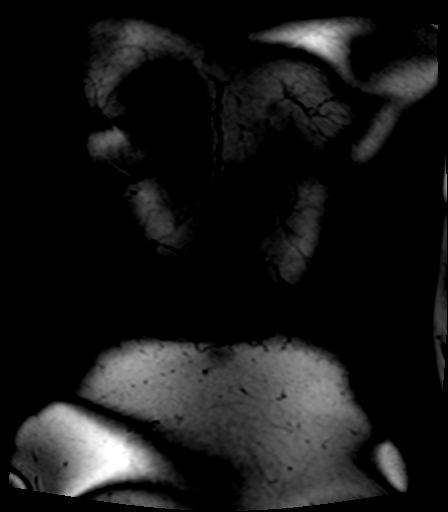

[Series 14: T2 · coronal · 3.0mm · 0.70mm/px · 2 of 54 slices shown (2 of 2)]
[im 1/54]
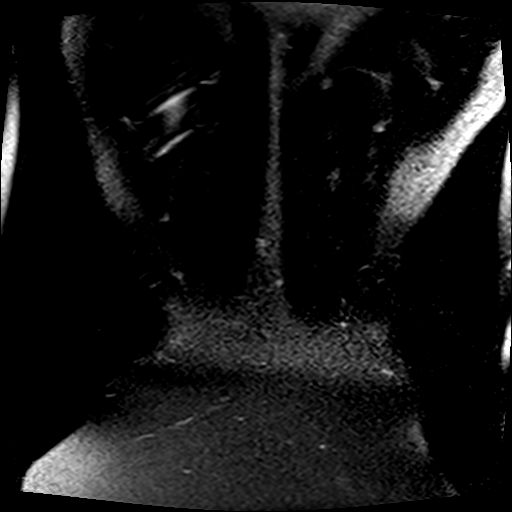
[im 54/54]
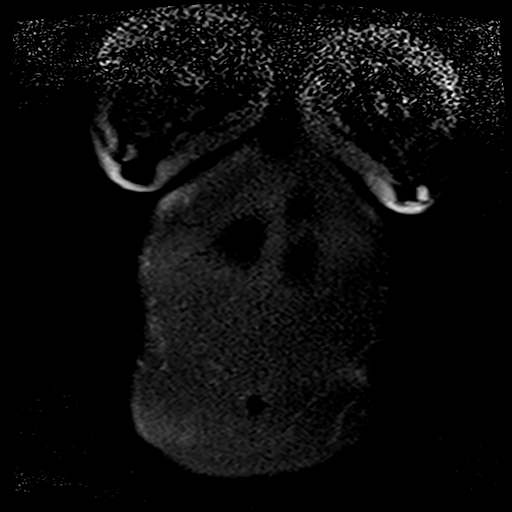

[Series 15: T1 · axial · 6.0mm · 0.74mm/px · z∈[-80,+132]mm · 3 of 66 slices shown]
[im 1/66]
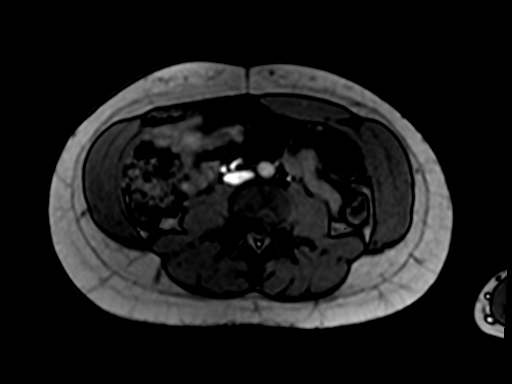
[im 33/66]
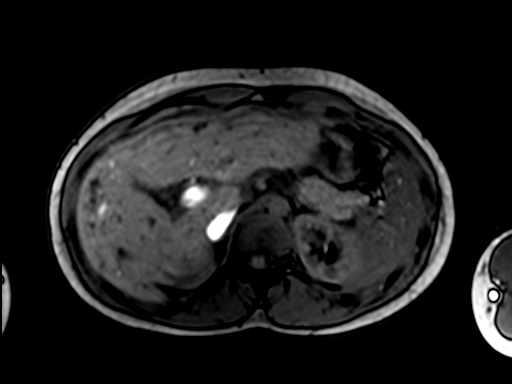
[im 66/66]
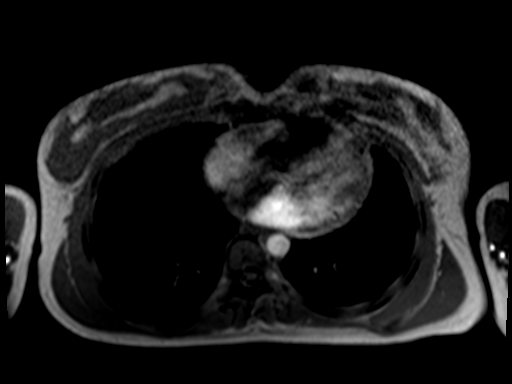

[Series 16: bSSFP · axial · 4.0mm · 0.74mm/px · z∈[-60,+124]mm · 2 of 47 slices shown (2 of 2)]
[im 1/47]
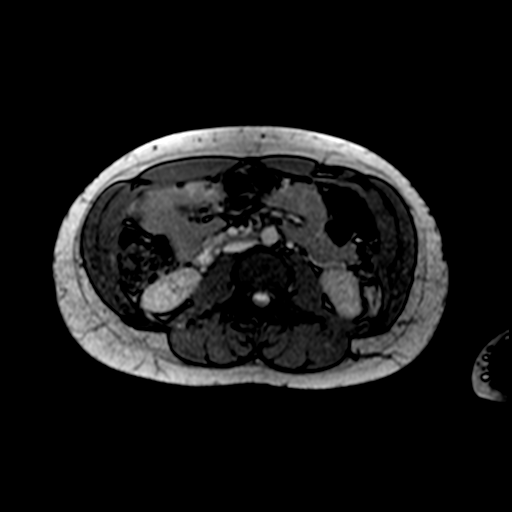
[im 47/47]
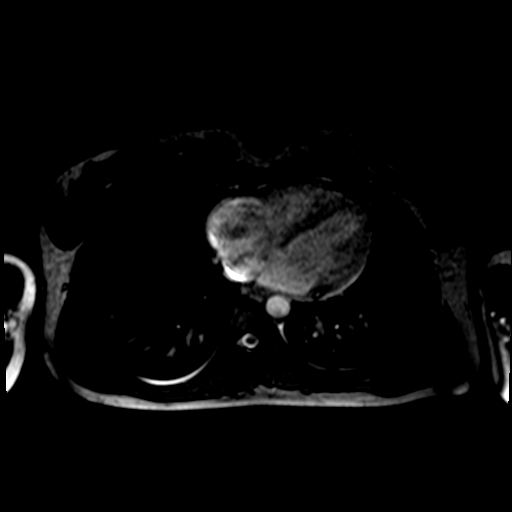

[Series 17: T1 dynamic · axial · non-contrast · 2.5mm · 0.74mm/px · z∈[-83,+135]mm · 4 of 88 slices shown]
[im 1/88]
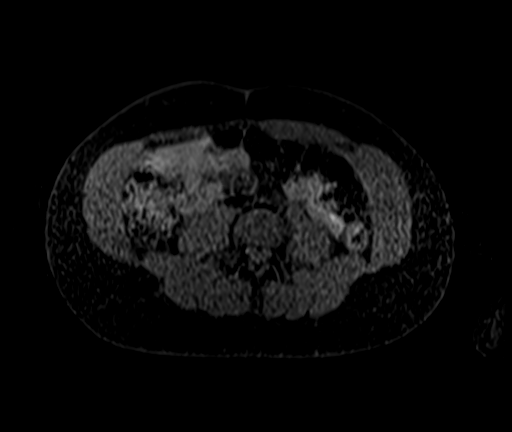
[im 30/88]
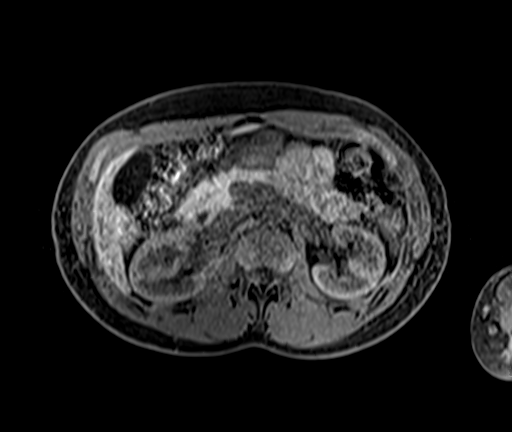
[im 59/88]
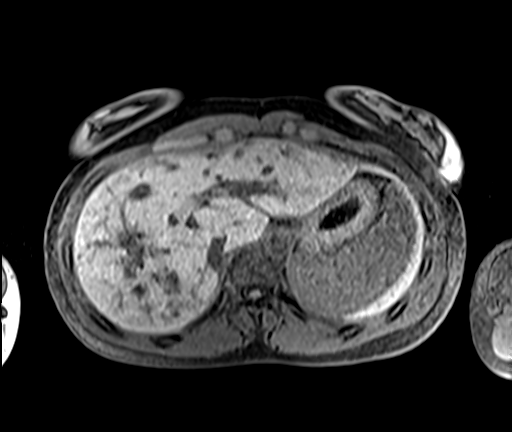
[im 88/88]
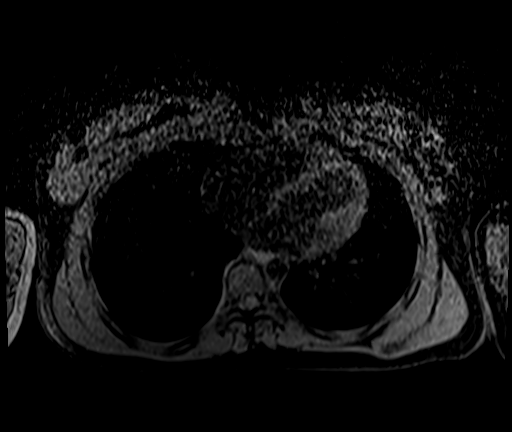

[Series 18: T1 dynamic post-contrast · axial · 2.5mm · 0.74mm/px · z∈[-83,+62]mm · 3 of 88 slices shown]
[im 1/88]
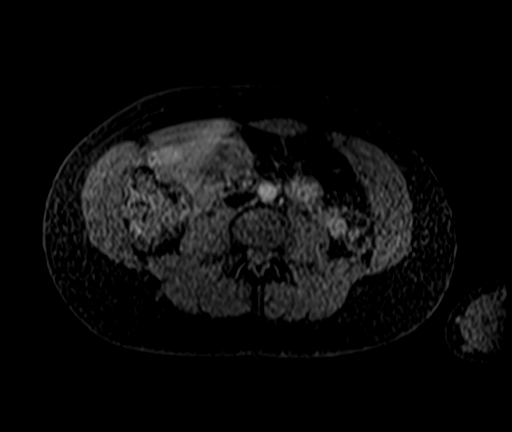
[im 30/88]
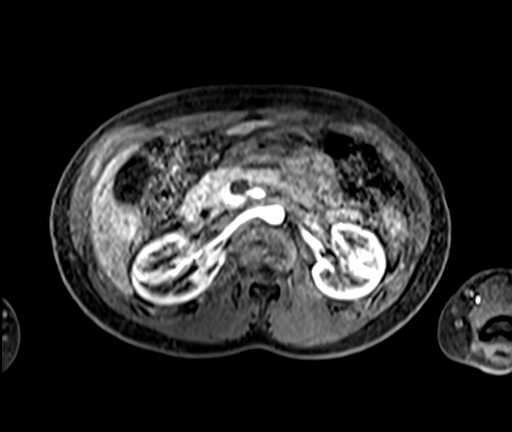
[im 59/88]
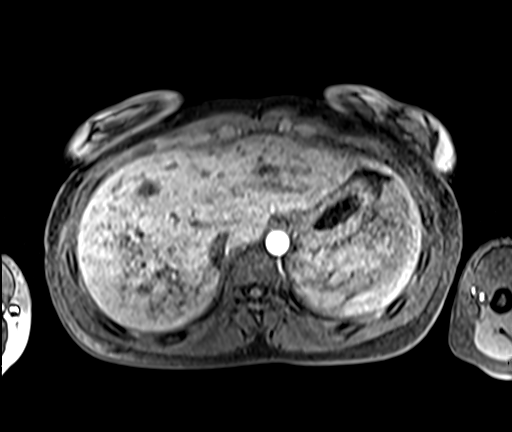

[26 of 48 positions shown; findings below may reference images not displayed]

FINDINGS: Lower chest: No acute findings.

Hepatobiliary: No hepatic steatosis. No arterially enhancing hepatic
lesion. 1 cm cyst in the left lobe of the liver. No evidence of
fibrosis. There is subtle irregularity of the hepatic parenchyma
involving the right lobe of the liver with mild caudate lobe
hypertrophy.

Gallbladder is grossly unremarkable. Similar right-sided predominant
intra and extrahepatic biliary dilation with a slight irregular
beaded appearance of the ducts. The common duct is still slightly
enlarged but decreased in size in comparison to prior now measuring
7 mm previously 9 mm. There is subtle peribiliary enhancement
predominately involving the ducts of the right lobe of the.

Pancreas: Normal intrinsic T1 signal the pancreatic parenchyma. No
pancreatic ductal dilation. No arterially enhancing or cystic
pancreatic lesions visualized.

Spleen:  Within normal limits.

Adrenals/Urinary Tract: No masses identified. No evidence of
hydronephrosis.

Stomach/Bowel: Visualized portions within the abdomen are
unremarkable.

Vascular/Lymphatic: No abdominal aortic aneurysm. Portal vein,
splenic vein and superior mesenteric veins are patent. The hepatic
veins are patent.

Other:  No abdominal ascites.

Musculoskeletal: No suspicious bone lesions identified.
IMPRESSION: 1. Similar right-sided predominant intra and extrahepatic biliary
dilation with a slight irregular beaded appearance of the ducts and
subtle peribiliary enhancement predominately involving the ducts of
the right lobe of the liver. Findings are suspicious for early
changes of primary sclerosing cholangitis.
2. Subtle irregularity of the hepatic parenchyma involving the right
lobe of the liver with mild caudate lobe hypertrophy. Correlate with
LFTs and consider early cirrhosis.
